# Patient Record
Sex: Female | Born: 1970 | Hispanic: Yes | Marital: Single | Smoking: Never smoker
Health system: Southern US, Community
[De-identification: ages and names within clinical notes are randomized; demographics above are authoritative.]

## PROBLEM LIST (undated history)

## (undated) DIAGNOSIS — T8859XA Other complications of anesthesia, initial encounter: Secondary | ICD-10-CM

## (undated) DIAGNOSIS — Z789 Other specified health status: Secondary | ICD-10-CM

## (undated) DIAGNOSIS — T4145XA Adverse effect of unspecified anesthetic, initial encounter: Secondary | ICD-10-CM

---

## 2018-02-25 ENCOUNTER — Ambulatory Visit: Admit: 2018-02-25 | Payer: Self-pay | Admitting: General Surgery

## 2018-02-25 ENCOUNTER — Other Ambulatory Visit: Payer: Self-pay

## 2018-02-25 ENCOUNTER — Encounter (HOSPITAL_COMMUNITY)
Admission: EM | Disposition: A | Discharge status: Home / Self Care | Payer: Self-pay | Source: Home / Self Care | Attending: Emergency Medicine

## 2018-02-25 ENCOUNTER — Emergency Department (HOSPITAL_COMMUNITY): Admitting: Certified Registered Nurse Anesthetist

## 2018-02-25 ENCOUNTER — Encounter (HOSPITAL_COMMUNITY): Payer: Self-pay

## 2018-02-25 ENCOUNTER — Emergency Department (HOSPITAL_COMMUNITY)

## 2018-02-25 ENCOUNTER — Ambulatory Visit (HOSPITAL_COMMUNITY)
Admission: EM | Admit: 2018-02-25 | Discharge: 2018-02-25 | Disposition: A | Discharge status: Home / Self Care | Attending: General Surgery | Admitting: General Surgery

## 2018-02-25 DIAGNOSIS — W230XXA Caught, crushed, jammed, or pinched between moving objects, initial encounter: Secondary | ICD-10-CM | POA: Diagnosis not present

## 2018-02-25 DIAGNOSIS — Z23 Encounter for immunization: Secondary | ICD-10-CM | POA: Insufficient documentation

## 2018-02-25 DIAGNOSIS — S6710XA Crushing injury of unspecified finger(s), initial encounter: Secondary | ICD-10-CM

## 2018-02-25 DIAGNOSIS — S6992XA Unspecified injury of left wrist, hand and finger(s), initial encounter: Secondary | ICD-10-CM | POA: Diagnosis present

## 2018-02-25 DIAGNOSIS — Y990 Civilian activity done for income or pay: Secondary | ICD-10-CM | POA: Diagnosis not present

## 2018-02-25 DIAGNOSIS — S68625A Partial traumatic transphalangeal amputation of left ring finger, initial encounter: Secondary | ICD-10-CM | POA: Diagnosis not present

## 2018-02-25 HISTORY — DX: Other specified health status: Z78.9

## 2018-02-25 HISTORY — DX: Adverse effect of unspecified anesthetic, initial encounter: T41.45XA

## 2018-02-25 HISTORY — DX: Other complications of anesthesia, initial encounter: T88.59XA

## 2018-02-25 HISTORY — PX: AMPUTATION: SHX166

## 2018-02-25 LAB — POCT PREGNANCY, URINE: Preg Test, Ur: NEGATIVE

## 2018-02-25 SURGERY — AMPUTATION DIGIT
Anesthesia: General | Laterality: Left

## 2018-02-25 MED ORDER — CEFAZOLIN SODIUM-DEXTROSE 1-4 GM/50ML-% IV SOLN
1.0000 g | Freq: Once | INTRAVENOUS | Status: AC
  Administered 2018-02-25: 1 g via INTRAVENOUS
  Filled 2018-02-25: qty 50

## 2018-02-25 MED ORDER — BUPIVACAINE-EPINEPHRINE (PF) 0.25% -1:200000 IJ SOLN
30.0000 mL | Freq: Once | INTRAMUSCULAR | Status: AC
  Administered 2018-02-25: 30 mL
  Filled 2018-02-25: qty 30

## 2018-02-25 MED ORDER — BACITRACIN ZINC 500 UNIT/GM EX OINT
TOPICAL_OINTMENT | CUTANEOUS | Status: AC
  Filled 2018-02-25: qty 28.35

## 2018-02-25 MED ORDER — POVIDONE-IODINE 10 % EX SWAB: 2.0000 "application " | Freq: Once | CUTANEOUS | Status: DC

## 2018-02-25 MED ORDER — LIDOCAINE HCL (CARDIAC) PF 100 MG/5ML IV SOSY
PREFILLED_SYRINGE | INTRAVENOUS | Status: DC | PRN
  Administered 2018-02-25: 60 mg via INTRAVENOUS

## 2018-02-25 MED ORDER — PROPOFOL 10 MG/ML IV BOLUS
INTRAVENOUS | Status: AC
  Filled 2018-02-25: qty 20

## 2018-02-25 MED ORDER — CEPHALEXIN 500 MG PO CAPS: 500.0000 mg | ORAL_CAPSULE | Freq: Three times a day (TID) | ORAL | 0 refills | Status: DC

## 2018-02-25 MED ORDER — PROPOFOL 10 MG/ML IV BOLUS
INTRAVENOUS | Status: DC | PRN
  Administered 2018-02-25: 150 mg via INTRAVENOUS

## 2018-02-25 MED ORDER — FENTANYL CITRATE (PF) 250 MCG/5ML IJ SOLN
INTRAMUSCULAR | Status: DC | PRN
  Administered 2018-02-25: 50 ug via INTRAVENOUS
  Administered 2018-02-25: 100 ug via INTRAVENOUS

## 2018-02-25 MED ORDER — FENTANYL CITRATE (PF) 250 MCG/5ML IJ SOLN
INTRAMUSCULAR | Status: AC
  Filled 2018-02-25: qty 5

## 2018-02-25 MED ORDER — LIDOCAINE HCL 2 % IJ SOLN
20.0000 mL | Freq: Once | INTRAMUSCULAR | Status: DC
  Filled 2018-02-25: qty 20

## 2018-02-25 MED ORDER — FENTANYL CITRATE (PF) 100 MCG/2ML IJ SOLN: 25.0000 ug | INTRAMUSCULAR | Status: DC | PRN

## 2018-02-25 MED ORDER — BUPIVACAINE HCL (PF) 0.25 % IJ SOLN
INTRAMUSCULAR | Status: AC
  Filled 2018-02-25: qty 30

## 2018-02-25 MED ORDER — 0.9 % SODIUM CHLORIDE (POUR BTL) OPTIME
TOPICAL | Status: DC | PRN
  Administered 2018-02-25: 1000 mL

## 2018-02-25 MED ORDER — ONDANSETRON HCL 4 MG/2ML IJ SOLN
INTRAMUSCULAR | Status: DC | PRN
  Administered 2018-02-25: 4 mg via INTRAVENOUS

## 2018-02-25 MED ORDER — TETANUS-DIPHTH-ACELL PERTUSSIS 5-2.5-18.5 LF-MCG/0.5 IM SUSP
0.5000 mL | Freq: Once | INTRAMUSCULAR | Status: AC
  Administered 2018-02-25: 0.5 mL via INTRAMUSCULAR
  Filled 2018-02-25: qty 0.5

## 2018-02-25 MED ORDER — BUPIVACAINE HCL (PF) 0.25 % IJ SOLN
INTRAMUSCULAR | Status: DC | PRN
  Administered 2018-02-25: 8 mL

## 2018-02-25 MED ORDER — CHLORHEXIDINE GLUCONATE 4 % EX LIQD: 60.0000 mL | Freq: Once | CUTANEOUS | Status: DC

## 2018-02-25 MED ORDER — TRAMADOL HCL 50 MG PO TABS: 100.0000 mg | ORAL_TABLET | Freq: Four times a day (QID) | ORAL | 0 refills | Status: AC | PRN

## 2018-02-25 MED ORDER — DEXAMETHASONE SODIUM PHOSPHATE 10 MG/ML IJ SOLN
INTRAMUSCULAR | Status: DC | PRN
  Administered 2018-02-25: 5 mg via INTRAVENOUS

## 2018-02-25 MED ORDER — MIDAZOLAM HCL 5 MG/5ML IJ SOLN
INTRAMUSCULAR | Status: DC | PRN
  Administered 2018-02-25: 2 mg via INTRAVENOUS

## 2018-02-25 MED ORDER — CEFAZOLIN SODIUM-DEXTROSE 2-4 GM/100ML-% IV SOLN
2.0000 g | INTRAVENOUS | Status: AC
  Administered 2018-02-25: 2 g via INTRAVENOUS

## 2018-02-25 MED ORDER — MIDAZOLAM HCL 2 MG/2ML IJ SOLN
INTRAMUSCULAR | Status: AC
  Filled 2018-02-25: qty 2

## 2018-02-25 MED ORDER — SODIUM CHLORIDE 0.9 % IV BOLUS
1000.0000 mL | Freq: Once | INTRAVENOUS | Status: AC
  Administered 2018-02-25: 1000 mL via INTRAVENOUS

## 2018-02-25 MED ORDER — LACTATED RINGERS IV SOLN
INTRAVENOUS | Status: DC
  Administered 2018-02-25: 16:00:00 via INTRAVENOUS

## 2018-02-25 MED ORDER — CEFAZOLIN SODIUM-DEXTROSE 2-4 GM/100ML-% IV SOLN
INTRAVENOUS | Status: AC
  Filled 2018-02-25: qty 100

## 2018-02-25 MED ORDER — BUPIVACAINE HCL 0.5 % IJ SOLN
50.0000 mL | Freq: Once | INTRAMUSCULAR | Status: DC
  Filled 2018-02-25: qty 50

## 2018-02-25 MED ORDER — PROMETHAZINE HCL 25 MG/ML IJ SOLN: 6.2500 mg | INTRAMUSCULAR | Status: DC | PRN

## 2018-02-25 SURGICAL SUPPLY — 35 items
BANDAGE ACE 4X5 VEL STRL LF (GAUZE/BANDAGES/DRESSINGS) ×3 IMPLANT
BNDG ESMARK 4X9 LF (GAUZE/BANDAGES/DRESSINGS) ×3 IMPLANT
BNDG GAUZE ELAST 4 BULKY (GAUZE/BANDAGES/DRESSINGS) ×3 IMPLANT
CHLORAPREP W/TINT 26ML (MISCELLANEOUS) IMPLANT
CORDS BIPOLAR (ELECTRODE) ×3 IMPLANT
COVER WAND RF STERILE (DRAPES) ×3 IMPLANT
DRSG ADAPTIC 3X8 NADH LF (GAUZE/BANDAGES/DRESSINGS) IMPLANT
ELECT REM PT RETURN 9FT ADLT (ELECTROSURGICAL)
ELECTRODE REM PT RTRN 9FT ADLT (ELECTROSURGICAL) IMPLANT
GAUZE SPONGE 4X4 12PLY STRL (GAUZE/BANDAGES/DRESSINGS) ×3 IMPLANT
GAUZE XEROFORM 1X8 LF (GAUZE/BANDAGES/DRESSINGS) ×3 IMPLANT
GLOVE BIOGEL M 8.0 STRL (GLOVE) ×3 IMPLANT
GOWN STRL REUS W/ TWL LRG LVL3 (GOWN DISPOSABLE) ×1 IMPLANT
GOWN STRL REUS W/ TWL XL LVL3 (GOWN DISPOSABLE) ×1 IMPLANT
GOWN STRL REUS W/TWL LRG LVL3 (GOWN DISPOSABLE) ×2
GOWN STRL REUS W/TWL XL LVL3 (GOWN DISPOSABLE) ×2
KIT BASIN OR (CUSTOM PROCEDURE TRAY) ×3 IMPLANT
KIT TURNOVER KIT B (KITS) ×3 IMPLANT
NS IRRIG 1000ML POUR BTL (IV SOLUTION) ×3 IMPLANT
PACK ORTHO EXTREMITY (CUSTOM PROCEDURE TRAY) ×3 IMPLANT
PAD ARMBOARD 7.5X6 YLW CONV (MISCELLANEOUS) ×6 IMPLANT
PAD CAST 4YDX4 CTTN HI CHSV (CAST SUPPLIES) IMPLANT
PADDING CAST COTTON 4X4 STRL (CAST SUPPLIES)
SOAP 2 % CHG 4 OZ (WOUND CARE) IMPLANT
SOLUTION BETADINE 4OZ (MISCELLANEOUS) ×3 IMPLANT
SPONGE LAP 18X18 RF (DISPOSABLE) ×3 IMPLANT
SPONGE LAP 4X18 RFD (DISPOSABLE) ×3 IMPLANT
SUT ETHILON 5 0 PS 2 18 (SUTURE) ×3 IMPLANT
SYR CONTROL 10ML LL (SYRINGE) ×3 IMPLANT
TOWEL OR 17X24 6PK STRL BLUE (TOWEL DISPOSABLE) ×3 IMPLANT
TOWEL OR 17X26 10 PK STRL BLUE (TOWEL DISPOSABLE) ×3 IMPLANT
TUBE CONNECTING 12'X1/4 (SUCTIONS)
TUBE CONNECTING 12X1/4 (SUCTIONS) IMPLANT
WATER STERILE IRR 1000ML POUR (IV SOLUTION) ×3 IMPLANT
YANKAUER SUCT BULB TIP NO VENT (SUCTIONS) IMPLANT

## 2018-02-25 NOTE — Consult Note (Signed)
Reason for Consult:Left ring finger injury Referring Physician: D Linda Hedges Alexa Hatfield is an 48 y.o. female.  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 She came to the ED for evaluation and hand surgery was consulted. She is RHD and Spanish-speaking only. Visit conducted with aid of interpreter.  No past medical history on file.  No family history on file.  Social History:  has no history on file for tobacco, alcohol, and drug.  Allergies: No Known Allergies  Medications: I have reviewed the patient's current medications.  No results found for this or any previous visit (from the past 48 hour(s)).  Dg Finger Ring Left  Result Date: 02/25/2018 CLINICAL DATA:  Amputation injury to the distal left fourth finger at work today EXAM: LEFT RING FINGER 2+V COMPARISON:  None. FINDINGS: There is a markedly comminuted compound fracture involving the entire distal phalanx of the left fourth finger including the proximal articular surface, with minimal 2 mm displacement of the distal fracture fragments. There is soft tissue amputation at the ulnar distal left fourth finger. No dislocation. No focal osseous lesions. No radiopaque foreign body. IMPRESSION: Markedly comminuted intra-articular compound fracture involving the entire distal phalanx of the left fourth finger, with ulnar aspect distal left fourth finger soft tissue amputation. Electronically Signed   By: Delbert Phenix M.Alexa.   On: 02/25/2018 12:00    Review of Systems  Constitutional: Negative for weight loss.  HENT: Negative for ear discharge, ear pain, hearing loss and tinnitus.   Eyes: Negative for blurred vision, double vision, photophobia and pain.  Respiratory: Negative for cough, sputum production and shortness of breath.   Cardiovascular: Negative for chest pain.  Gastrointestinal: Negative for abdominal pain, nausea and vomiting.  Genitourinary: Negative for dysuria,  flank pain, frequency and urgency.  Musculoskeletal: Positive for joint pain (Left ring finger). Negative for back pain, falls, myalgias and neck pain.  Neurological: Negative for dizziness, tingling, sensory change, focal weakness, loss of consciousness and headaches.  Endo/Heme/Allergies: Does not bruise/bleed easily.  Psychiatric/Behavioral: Negative for depression, memory loss and substance abuse. The patient is not nervous/anxious.    Blood pressure (!) 159/95, pulse 74, temperature 98.4 F (36.9 C), temperature source Oral, resp. rate 18, height 5\' 3"  (1.6 m), weight 54.4 kg, last menstrual period 02/06/2018, SpO2 100 %. Physical Exam  Constitutional: She appears well-developed and well-nourished. No distress.  HENT:  Head: Normocephalic and atraumatic.  Eyes: Conjunctivae are normal. Right eye exhibits no discharge. Left eye exhibits no discharge. No scleral icterus.  Neck: Normal range of motion.  Cardiovascular: Normal rate and regular rhythm.  Respiratory: Effort normal. No respiratory distress.  Musculoskeletal:     Comments: Left shoulder, elbow, wrist, digits- Partial amputation of P3, approx 50% of ulnar aspect missing including most of nail, flex/ext DIP 5/5, no instability, no blocks to motion  Sens  Ax/R/M/U intact  Mot   Ax/ R/ PIN/ M/ AIN/ U intact  Rad 2+  Neurological: She is alert.  Skin: Skin is warm and dry. She is not diaphoretic.  Psychiatric: She has a normal mood and affect. Her behavior is normal.      Assessment/Plan: Left ring finger partial amputation -- Will need amputation revision by Dr. Izora Hatfield this afternoon. Anticipate discharge post-op. Please keep NPO.    Alexa Caldron, PA-C Orthopedic Surgery [phone] 02/25/2018, 12:33 PM

## 2018-02-25 NOTE — Discharge Instructions (Addendum)
Discharge Instructions:  Keep your dressing clean, dry and in place until instructed to remove by Dr. Coley.  If the dressing becomes dirty or wet call the office for instructions during business hours. Elevate the extremity to help with swelling, this will also help with any discomfort. Take your medication as prescribed. No lifting with the injured  extremity. If you feel that the dressing is too tight, you may loosen it, but keep it on; finger tips should be pink; if there is a concern, call the office. [phone] Ice may be used if the injury is a fracture, do not apply ice directly to the skin. Please call the office on the next business day after discharge to arrange a follow up appointment.  Call [phone] between the hours of 9am - 5pm M-Th or 9am - 1pm on Fri. For most hand injuries and/or conditions, you may return to work using the uninjured hand (one handed duty) within 24-72 hours.  A detailed note will be provided to you at your follow up appointment or may contact the office prior to your follow up.    

## 2018-02-25 NOTE — ED Provider Notes (Signed)
[facility] EMERGENCY DEPARTMENT Provider Note   CSN: [id number] Arrival date & time: 02/25/18  1119    History   Chief Complaint Chief Complaint  Patient presents with  . Finger Injury    HPI Alexa Hatfield is a 48 y.o. female.     HPI   48 year old female presents today with amputation of her right distal fourth finger.  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  She notes immediate pain.  She was brought via EMS no medication given prior.  Patient denies any other injuries throughout the hand.  Uncertain tetanus vaccination status.  No past medical history on file.  There are no active problems to display for this patient.    The histories are not reviewed yet. Please review them in the "History" navigator section and refresh this SmartLink.   OB History   No obstetric history on file.      Home Medications    Prior to Admission medications   Not on File    Family History No family history on file.  Social History Social History   Tobacco Use  . Smoking status: Not on file  Substance Use Topics  . Alcohol use: Not on file  . Drug use: Not on file     Allergies   Patient has no known allergies.   Review of Systems Review of Systems  All other systems reviewed and are negative.    Physical Exam Updated Vital Signs BP (!) 159/95 (BP Location: Right Arm)   Pulse 74   Temp 98.4 F (36.9 C) (Oral)   Resp 18   Ht 5\' 3"  (1.6 m)   Wt 54.4 kg   LMP 02/06/2018   SpO2 100%   BMI 21.26 kg/m   Physical Exam Vitals signs and nursing note reviewed.  Constitutional:      Appearance: She is well-developed.  HENT:     Head: Normocephalic and atraumatic.  Eyes:     General: No scleral icterus.       Right eye: No discharge.        Left eye: No discharge.     Conjunctiva/sclera: Conjunctivae normal.     Pupils: Pupils are equal, round, and reactive to light.  Neck:   Musculoskeletal: Normal range of motion.     Vascular: No JVD.     Trachea: No tracheal deviation.  Pulmonary:     Effort: Pulmonary effort is normal.     Breath sounds: No stridor.  Musculoskeletal:     Comments: See photo below-nontender to palpation  Neurological:     Mental Status: She is alert and oriented to person, place, and time.     Coordination: Coordination normal.  Psychiatric:        Behavior: Behavior normal.        Thought Content: Thought content normal.        Judgment: Judgment normal.        ED Treatments / Results  Labs (all labs ordered are listed, but only abnormal results are displayed) Labs Reviewed - No data to display  EKG None  Radiology Dg Finger Ring Left  Result Date: 02/25/2018 CLINICAL DATA:  Amputation injury to the distal left fourth finger at work today EXAM: LEFT RING FINGER 2+V COMPARISON:  None. FINDINGS: There is a markedly comminuted compound fracture involving the entire distal phalanx of the left fourth finger including the proximal articular surface, with  minimal 2 mm displacement of the distal fracture fragments. There is soft tissue amputation at the ulnar distal left fourth finger. No dislocation. No focal osseous lesions. No radiopaque foreign body. IMPRESSION: Markedly comminuted intra-articular compound fracture involving the entire distal phalanx of the left fourth finger, with ulnar aspect distal left fourth finger soft tissue amputation. Electronically Signed   By: Delbert Phenix M.D.   On: 02/25/2018 12:00    Procedures Procedures (including critical care time)  Medications Ordered in ED Medications  lidocaine (XYLOCAINE) 2 % (with pres) injection 400 mg (has no administration in time range)  ceFAZolin (ANCEF) IVPB 1 g/50 mL premix (0 g Intravenous Stopped 02/25/18 1257)  Tdap (BOOSTRIX) injection 0.5 mL (0.5 mLs Intramuscular Given 02/25/18 1209)  bupivacaine-epinephrine (MARCAINE W/ EPI) 0.25% -1:200000 injection 30 mL (30  mLs Infiltration Given 02/25/18 1214)  sodium chloride 0.9 % bolus 1,000 mL (0 mLs Intravenous Stopped 02/25/18 1257)     Initial Impression / Assessment and Plan / ED Course  I have reviewed the triage vital signs and the nursing notes.  Pertinent labs & imaging results that were available during my care of the patient were reviewed by me and considered in my medical decision making (see chart for details).        48 year old female presents today with amputation of the distal phalanx on the right hand.  Orthopedics consulted.  Patient received antibiotics, wound was irrigated at bedside, tetanus updated.  She will go to the OR for management.  Final Clinical Impressions(s) / ED Diagnoses   Final diagnoses:  Crushing injury of finger, initial encounter    ED Discharge Orders    None       Alexa Hatfield 02/25/18 1326    Loren Racer, MD 02/26/18 1527

## 2018-02-25 NOTE — Progress Notes (Signed)
Attempted to contact pharmacy for medication reconciliation, with no response.  However patient states she does not take any medications.

## 2018-02-25 NOTE — Anesthesia Procedure Notes (Signed)
Procedure Name: LMA Insertion Date/Time: 02/25/2018 3:58 PM Performed by: Marena Chancy, CRNA Pre-anesthesia Checklist: Patient identified, Emergency Drugs available, Suction available and Patient being monitored Patient Re-evaluated:Patient Re-evaluated prior to induction Oxygen Delivery Method: Circle System Utilized Preoxygenation: Pre-oxygenation with 100% oxygen Induction Type: IV induction Ventilation: Mask ventilation without difficulty LMA: LMA inserted LMA Size: 4.0 Number of attempts: 1 Airway Equipment and Method: Bite block Placement Confirmation: positive ETCO2 Tube secured with: Tape Dental Injury: Teeth and Oropharynx as per pre-operative assessment

## 2018-02-25 NOTE — ED Notes (Signed)
Patient transported to X-ray 

## 2018-02-25 NOTE — Anesthesia Preprocedure Evaluation (Signed)
Anesthesia Evaluation  Patient identified by MRN, date of birth, ID band Patient awake    Reviewed: Allergy & Precautions, NPO status , Patient's Chart, lab work & pertinent test results  Airway Mallampati: II  TM Distance: >3 FB Neck ROM: Full    Dental no notable dental hx.    Pulmonary neg pulmonary ROS,    Pulmonary exam normal breath sounds clear to auscultation       Cardiovascular negative cardio ROS Normal cardiovascular exam Rhythm:Regular Rate:Normal     Neuro/Psych negative neurological ROS  negative psych ROS   GI/Hepatic negative GI ROS, Neg liver ROS,   Endo/Other  negative endocrine ROS  Renal/GU negative Renal ROS  negative genitourinary   Musculoskeletal negative musculoskeletal ROS (+)   Abdominal   Peds negative pediatric ROS (+)  Hematology negative hematology ROS (+)   Anesthesia Other Findings   Reproductive/Obstetrics negative OB ROS                             Anesthesia Physical Anesthesia Plan  ASA: II  Anesthesia Plan: General   Post-op Pain Management:    Induction: Intravenous  PONV Risk Score and Plan: 3 and Ondansetron, Dexamethasone and Treatment may vary due to age or medical condition  Airway Management Planned: LMA  Additional Equipment:   Intra-op Plan:   Post-operative Plan: Extubation in OR  Informed Consent: I have reviewed the patients History and Physical, chart, labs and discussed the procedure including the risks, benefits and alternatives for the proposed anesthesia with the patient or authorized representative who has indicated his/her understanding and acceptance.     Dental advisory given  Plan Discussed with: CRNA and Surgeon  Anesthesia Plan Comments:         Anesthesia Quick Evaluation  

## 2018-02-25 NOTE — Transfer of Care (Signed)
Immediate Anesthesia Transfer of Care Note  Patient: Alexa Hatfield  Procedure(s) Performed: AMPUTATION OF FINGER (Left )  Patient Location: PACU  Anesthesia Type:General  Level of Consciousness: awake and patient cooperative  Airway & Oxygen Therapy: Patient Spontanous Breathing and Patient connected to face mask oxygen  Post-op Assessment: Report given to RN, Post -op Vital signs reviewed and stable and Patient moving all extremities X 4  Post vital signs: Reviewed and stable  Last Vitals:  Vitals Value Taken Time  BP 121/81 02/25/2018  4:36 PM  Temp    Pulse 96 02/25/2018  4:36 PM  Resp 14 02/25/2018  4:36 PM  SpO2 100 % 02/25/2018  4:36 PM  Vitals shown include unvalidated device data.  Last Pain:  Vitals:   02/25/18 1532  TempSrc:   PainSc: 0-No pain      Patients Stated Pain Goal: 0 (02/25/18 1131)  Complications: No apparent anesthesia complications

## 2018-02-25 NOTE — Op Note (Signed)
NAME: Alexa Hatfield, Alexa Hatfield MEDICAL RECORD IO:[id number] ACCOUNT [id number] DATE OF BIRTH:07/17/1970 FACILITY: [facility] LOCATION: [facility] PHYSICIAN:Suhaib Guzzo Harle Battiest, MD  OPERATIVE REPORT  DATE OF PROCEDURE:  02/25/2018  PREOPERATIVE DIAGNOSIS:  Partial amputation of her left small finger.  POSTOPERATIVE DIAGNOSIS:  Partial amputation of her left small finger.  PROCEDURE:  Revision amputation of the left ring finger with a local advancement flap.  ANESTHESIA:  General.  COMPLICATIONS:  No acute complications.  SPECIMENS:  No specimens.  INDICATIONS:  The patient is a 48 year old female who was at work and had some press type machine come down on her distal left ring finger nearly amputating the finger just proximal to the base of the nail.  On evaluation, she had what appeared to be a  non-salvageable fingertip with comminuted open fracture of the distal phalanx, significant skin loss on the radial side.  Risks, benefits and alternatives of revision amputation were discussed with her.  She agreed with this course of action.  CONSENT:  Consent was obtained.  DESCRIPTION OF PROCEDURE:  The patient was taken to the operating room and placed supine on the operating table.  Anesthesia was administered.  A timeout was performed identifying the correct procedure, preoperative antibiotics were given.  The left  upper extremity was prepped and draped in normal sterile fashion.  A tourniquet was used on the upper arm exsanguinated and inflated to 250 mmHg.  The wound was addressed.  There was quite a bit of destruction on the radial aspect of the ring finger.   There was open fracture, multiple distal phalanx fragments.  The neurovascular bundle on the radial side was obliterated.  There was a larger skin flap on the ulnar side with some residual nail bed present.  The full thickness skin, subcutaneous tissue,  bone and nail bed were debrided to create a local advancement flap to cover the defect.   The entire distal phalanx had to be removed to create a tension-free closure.  This wound was then irrigated thoroughly with saline solution.  The tourniquet was  then released.  Hemostasis was controlled with bipolar cautery.  The skin flap was then advanced over the edge of the area of the distal part of the middle phalanx and closed nicely with multiple interrupted 5-0 nylon sutures.  Sterile dressing was  applied.  The patient tolerated the procedure well.  TN/NUANCE  D:02/25/2018 T:02/25/2018 JOB:[id number]

## 2018-02-25 NOTE — ED Triage Notes (Signed)
Reported Lt ring finger injury from a metal press at work.. Crush injury to Lt ring finger

## 2018-02-25 NOTE — Progress Notes (Signed)
I have seen and examined this patient and agree with the history and physical exam by PA Charma Igo.   Will proceed with revision amputation.  All questions answered.

## 2018-02-26 ENCOUNTER — Encounter (HOSPITAL_COMMUNITY): Payer: Self-pay | Admitting: General Surgery

## 2018-02-26 NOTE — Anesthesia Postprocedure Evaluation (Signed)
Anesthesia Post Note  Patient: Alexa Hatfield  Procedure(s) Performed: AMPUTATION OF FINGER (Left )     Patient location during evaluation: PACU Anesthesia Type: General Level of consciousness: awake and alert Pain management: pain level controlled Vital Signs Assessment: post-procedure vital signs reviewed and stable Respiratory status: spontaneous breathing, nonlabored ventilation, respiratory function stable and patient connected to nasal cannula oxygen Cardiovascular status: blood pressure returned to baseline and stable Postop Assessment: no apparent nausea or vomiting Anesthetic complications: no    Last Vitals:  Vitals:   02/25/18 1650 02/25/18 1715  BP: 120/85 119/79  Pulse: 83 74  Resp: 18 16  Temp: 36.5 C   SpO2: 97% 99%    Last Pain:  Vitals:   02/25/18 1650  TempSrc:   PainSc: 0-No pain                 Marcia Hartwell S

## 2018-04-10 ENCOUNTER — Ambulatory Visit: Payer: No Typology Code available for payment source | Admitting: Occupational Therapy

## 2020-11-28 ENCOUNTER — Emergency Department (HOSPITAL_COMMUNITY): Payer: 59

## 2020-11-28 ENCOUNTER — Inpatient Hospital Stay (HOSPITAL_BASED_OUTPATIENT_CLINIC_OR_DEPARTMENT_OTHER)
Admission: EM | Admit: 2020-11-28 | Discharge: 2020-12-05 | DRG: 065 | Disposition: A | Discharge status: Home / Self Care | Payer: 59 | Attending: Internal Medicine | Admitting: Internal Medicine

## 2020-11-28 ENCOUNTER — Encounter (HOSPITAL_BASED_OUTPATIENT_CLINIC_OR_DEPARTMENT_OTHER): Payer: Self-pay

## 2020-11-28 ENCOUNTER — Other Ambulatory Visit: Payer: Self-pay

## 2020-11-28 DIAGNOSIS — Z20822 Contact with and (suspected) exposure to covid-19: Secondary | ICD-10-CM | POA: Diagnosis present

## 2020-11-28 DIAGNOSIS — I639 Cerebral infarction, unspecified: Secondary | ICD-10-CM

## 2020-11-28 DIAGNOSIS — I10 Essential (primary) hypertension: Secondary | ICD-10-CM | POA: Diagnosis present

## 2020-11-28 DIAGNOSIS — Z8249 Family history of ischemic heart disease and other diseases of the circulatory system: Secondary | ICD-10-CM

## 2020-11-28 DIAGNOSIS — Z79899 Other long term (current) drug therapy: Secondary | ICD-10-CM

## 2020-11-28 DIAGNOSIS — I6340 Cerebral infarction due to embolism of unspecified cerebral artery: Secondary | ICD-10-CM | POA: Diagnosis not present

## 2020-11-28 DIAGNOSIS — I253 Aneurysm of heart: Secondary | ICD-10-CM | POA: Diagnosis present

## 2020-11-28 DIAGNOSIS — Q2112 Patent foramen ovale: Secondary | ICD-10-CM

## 2020-11-28 DIAGNOSIS — Z89022 Acquired absence of left finger(s): Secondary | ICD-10-CM

## 2020-11-28 DIAGNOSIS — R42 Dizziness and giddiness: Secondary | ICD-10-CM | POA: Diagnosis present

## 2020-11-28 DIAGNOSIS — I6389 Other cerebral infarction: Secondary | ICD-10-CM | POA: Diagnosis present

## 2020-11-28 DIAGNOSIS — R29700 NIHSS score 0: Secondary | ICD-10-CM | POA: Diagnosis present

## 2020-11-28 DIAGNOSIS — E872 Acidosis, unspecified: Secondary | ICD-10-CM | POA: Diagnosis present

## 2020-11-28 DIAGNOSIS — E785 Hyperlipidemia, unspecified: Secondary | ICD-10-CM | POA: Diagnosis present

## 2020-11-28 DIAGNOSIS — Z8673 Personal history of transient ischemic attack (TIA), and cerebral infarction without residual deficits: Secondary | ICD-10-CM

## 2020-11-28 DIAGNOSIS — H532 Diplopia: Secondary | ICD-10-CM | POA: Diagnosis present

## 2020-11-28 LAB — BASIC METABOLIC PANEL
Anion gap: 8 (ref 5–15)
BUN: 9 mg/dL (ref 6–20)
CO2: 17 mmol/L — ABNORMAL LOW (ref 22–32)
Calcium: 8.9 mg/dL (ref 8.9–10.3)
Chloride: 112 mmol/L — ABNORMAL HIGH (ref 98–111)
Creatinine, Ser: 0.95 mg/dL (ref 0.44–1.00)
GFR, Estimated: >60 mL/min (ref >60)
Glucose, Bld: 91 mg/dL (ref 70–99)
Potassium: 3.8 mmol/L (ref 3.5–5.1)
Sodium: 137 mmol/L (ref 135–145)

## 2020-11-28 LAB — CBC WITH DIFFERENTIAL/PLATELET
Abs Immature Granulocytes: 0.03 10*3/uL (ref 0.00–0.07)
Basophils Absolute: 0 10*3/uL (ref 0.0–0.1)
Basophils Relative: 0 %
Eosinophils Absolute: 0.1 10*3/uL (ref 0.0–0.5)
Eosinophils Relative: 1 %
HCT: 40.7 % (ref 36.0–46.0)
Hemoglobin: 13.6 g/dL (ref 12.0–15.0)
Immature Granulocytes: 0 %
Lymphocytes Relative: 15 %
Lymphs Abs: 1.5 10*3/uL (ref 0.7–4.0)
MCH: 31.4 pg (ref 26.0–34.0)
MCHC: 33.4 g/dL (ref 30.0–36.0)
MCV: 94 fL (ref 80.0–100.0)
Monocytes Absolute: 0.5 10*3/uL (ref 0.1–1.0)
Monocytes Relative: 6 %
Neutro Abs: 7.4 10*3/uL (ref 1.7–7.7)
Neutrophils Relative %: 78 %
Platelets: 234 10*3/uL (ref 150–400)
RBC: 4.33 MIL/uL (ref 3.87–5.11)
RDW: 13.2 % (ref 11.5–15.5)
WBC: 9.5 10*3/uL (ref 4.0–10.5)
nRBC: 0 % (ref 0.0–0.2)

## 2020-11-28 LAB — RESP PANEL BY RT-PCR (FLU A&B, COVID) ARPGX2
Influenza A by PCR: NEGATIVE
Influenza B by PCR: NEGATIVE
SARS Coronavirus 2 by RT PCR: NEGATIVE

## 2020-11-28 MED ORDER — ACETAMINOPHEN 160 MG/5ML PO SOLN: 650.0000 mg | ORAL | Status: DC | PRN

## 2020-11-28 MED ORDER — STROKE: EARLY STAGES OF RECOVERY BOOK
Freq: Once | Status: AC
  Filled 2020-11-28: qty 1

## 2020-11-28 MED ORDER — SODIUM CHLORIDE 0.9 % IV SOLN: INTRAVENOUS | Status: AC

## 2020-11-28 MED ORDER — ENOXAPARIN SODIUM 40 MG/0.4ML IJ SOSY
40.0000 mg | PREFILLED_SYRINGE | INTRAMUSCULAR | Status: DC
  Administered 2020-11-28 – 2020-12-04 (×7): 40 mg via SUBCUTANEOUS
  Filled 2020-11-28 (×7): qty 0.4

## 2020-11-28 MED ORDER — ACETAMINOPHEN 650 MG RE SUPP: 650.0000 mg | RECTAL | Status: DC | PRN

## 2020-11-28 MED ORDER — ACETAMINOPHEN 325 MG PO TABS: 650.0000 mg | ORAL_TABLET | ORAL | Status: DC | PRN

## 2020-11-28 MED ORDER — GADOBUTROL 1 MMOL/ML IV SOLN
6.5000 mL | Freq: Once | INTRAVENOUS | Status: AC | PRN
  Administered 2020-11-28: 6.5 mL via INTRAVENOUS

## 2020-11-28 MED ORDER — SENNOSIDES-DOCUSATE SODIUM 8.6-50 MG PO TABS: 1.0000 | ORAL_TABLET | Freq: Every evening | ORAL | Status: DC | PRN

## 2020-11-28 NOTE — ED Triage Notes (Signed)
Pt states she has been seeing double 3 days ago and increased fatigue.. Daughter states has had increased memory problems.   Went to [place] and was noted to be drowsy and acting abnormal, went to ER there, was referred to neurologist. Stated there was nerve damage from birth control pills.

## 2020-11-28 NOTE — ED Notes (Signed)
Patient getting letters wrong on multiple lines, confusing them for different letters. Patients family member states that patients vision is not normally this affected, so that this is a new problem. Patient family states no issue with language barrier and that patients vision is usually okay when wearing her glasses.

## 2020-11-28 NOTE — ED Notes (Addendum)
MRI notified of patients arrival from [facility].

## 2020-11-28 NOTE — ED Provider Notes (Signed)
Patient transferred from [facility] for MRI due to the diplopia.  Anticipate disposition based on MRI results.  I reviewed the MRI and then spoke to neurology who agrees it appears like an acute stroke.  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  Neurology says that she will need admission for further stroke work-up.  When patient returns from MRI and I evaluate her, will place admission call.  I assessed patient and she is resting comfortably.  She is keeping 1 eye closed to help with the diplopia.  She continues to deny any other symptoms aside from the vision changes.  She was informed of the acute stroke seen on MRI and that she will be admitted.  Neurology will see patient and she will be admitted.  Clinical Impression: 1. Diplopia   2. Cerebrovascular accident (CVA), unspecified mechanism (HCC)     Disposition: Admit  This note was prepared with assistance of Dragon voice recognition software. Occasional wrong-word or sound-a-like substitutions may have occurred due to the inherent limitations of voice recognition software.     Maysel Mccolm, Canary Brim, MD 11/28/20 2225

## 2020-11-28 NOTE — ED Provider Notes (Signed)
[facility] EMERGENCY DEPARTMENT Provider Note   CSN: [id number] Arrival date & time: 11/28/20  1113     History Chief Complaint  Patient presents with   Diplopia    Alexa Hatfield is a 50 y.o. female who presents the emergency department with a 3-day history of constant bilateral diplopia.  Patient states that she recently stopped taking her birth control after returning from the [place] 4 days ago and she attributes these to her symptoms.  Nothing seems to make it better or worse.  She is unable to characterize her diplopia.  She denies any eye pain, drainage, visual loss, focal weakness/numbness, cough, congestion, fever, chills, chest pain, and shortness of breath.  A language interpreter was used.      Past Medical History:  Diagnosis Date   Complication of anesthesia    "difficulty breathing and required O2 during c-section"   Medical history non-contributory     There are no problems to display for this patient.   Past Surgical History:  Procedure Laterality Date   AMPUTATION Left 02/25/2018   Procedure: AMPUTATION OF FINGER;  Surgeon: Alexa Neu, MD;  Location: [facility] OR;  Service: Plastics;  Laterality: Left;   CESAREAN SECTION     x2     OB History   No obstetric history on file.     History reviewed. No pertinent family history.  Social History   Tobacco Use   Smoking status: Never   Smokeless tobacco: Never  Vaping Use   Vaping Use: Never used  Substance Use Topics   Alcohol use: Not Currently   Drug use: Never    Home Medications Prior to Admission medications   Medication Sig Start Date End Date Taking? Authorizing Provider  cephALEXin (KEFLEX) 500 MG capsule Take 1 capsule (500 mg total) by mouth 3 (three) times daily. 02/25/18   Alexa Neu, MD    Allergies    Patient has no known allergies.  Review of Systems   Review of Systems  All other systems reviewed and are negative.  Physical Exam Updated Vital  Signs BP (!) 129/92   Pulse 68   Temp 98.2 F (36.8 C) (Oral)   Resp 20   Ht 5\' 3"  (1.6 m)   Wt 67 kg   LMP 11/03/2020 (Approximate)   SpO2 100%   BMI 26.17 kg/m   Physical Exam Vitals and nursing note reviewed.  Constitutional:      General: She is not in acute distress.    Appearance: Normal appearance.  HENT:     Head: Normocephalic and atraumatic.  Eyes:     General:        Right eye: No discharge.        Left eye: No discharge.     Extraocular Movements:     Right eye: Abnormal extraocular motion present.     Left eye: Abnormal extraocular motion present.     Pupils: Pupils are equal, round, and reactive to light.  Cardiovascular:     Comments: Regular rate and rhythm.  S1/S2 are distinct without any evidence of murmur, rubs, or gallops.  Radial pulses are 2+ bilaterally.  Dorsalis pedis pulses are 2+ bilaterally.  No evidence of pedal edema. Pulmonary:     Comments: Clear to auscultation bilaterally.  Normal effort.  No respiratory distress.  No evidence of wheezes, rales, or rhonchi heard throughout. Abdominal:     General: Abdomen is flat. Bowel sounds are normal. There is no distension.  Tenderness: There is no abdominal tenderness. There is no guarding or rebound.  Musculoskeletal:        General: Normal range of motion.     Cervical back: Neck supple.  Skin:    General: Skin is warm and dry.     Findings: No rash.  Neurological:     General: No focal deficit present.     Mental Status: She is alert.     Comments: Patient having trouble tracking to the right with extra occular movements. The rest of her occular movements are intact. The rest of her cranial nerves are intact. 5/5 strength in the upper and Hatfield extremities.  Normal sensation to the upper and Hatfield extremities.  Psychiatric:        Mood and Affect: Mood normal.        Behavior: Behavior normal.    ED Results / Procedures / Treatments   Labs (all labs ordered are listed, but only abnormal  results are displayed) Labs Reviewed  BASIC METABOLIC PANEL - Abnormal; Notable for the following components:      Result Value   Chloride 112 (*)    CO2 17 (*)    All other components within normal limits  CBC WITH DIFFERENTIAL/PLATELET    EKG None  Radiology No results found.  Procedures Procedures   Medications Ordered in ED Medications - No data to display  ED Course  I have reviewed the triage vital signs and the nursing notes.  Pertinent labs & imaging results that were available during my care of the patient were reviewed by me and considered in my medical decision making (see chart for details).  Clinical Course as of 11/28/20 1550  Mon Nov 28, 2020  1448 I spoke with Dr. Iver Hatfield with neurology who recommended MRI brain and orbits with and without contrast. [CF]    Clinical Course User Index [CF] Alexa Lower, PA-C   MDM Rules/Calculators/A&P                          Alexa Hatfield is a 50 y.o. female who presents the emergency department for for evaluation of diplopia.  History and physical exam is concerning for cranial nerve palsy.  It is difficult to assess which cranial nerve as there is discrepancy between myself and Dr. Salena Hatfield physical exam.  After discussing the case with neurology thinks that she needs to be further evaluated with an MRI which cannot be performed at [facility].  I will transfer the patient to [facility] for MRI. Spoke with Dr. Particia Hatfield who let Dr. Anitra Hatfield know and will accept the patient.  Patient will go by private vehicle.  The daughter will be driving her.   Final Clinical Impression(s) / ED Diagnoses Final diagnoses:  Diplopia    Rx / DC Orders ED Discharge Orders     None        Alexa Hatfield 11/28/20 1937    Alexa Sleeper, MD 11/29/20 [phone]

## 2020-11-28 NOTE — ED Notes (Signed)
Back from MRI at this time. Waiting for further orders.

## 2020-11-28 NOTE — ED Notes (Signed)
Pt for transfer to [facility] ED for MRI, Dr [name] accepting.  Pt to transfer by private vehicle, PIV in place to right [employer], wrapped for protection for transport.  Family provided directions to [facility]

## 2020-11-28 NOTE — ED Notes (Signed)
Utilized spanish interpreter

## 2020-11-28 NOTE — ED Notes (Signed)
Pt sitting up on stretcher eating soup that was brought in by family. Pt denies any complaints, states her vision has improved. No acute changes noted. Will continue to monitor.

## 2020-11-28 NOTE — ED Notes (Signed)
Admitting Provider at bedside. 

## 2020-11-28 NOTE — ED Notes (Signed)
Patient transported to MRI 

## 2020-11-28 NOTE — H&P (Signed)
History and Physical    Alexa Hatfield OAC:[id number] DOB: [birth year] DOA: 11/28/2020  PCP: Pcp, No   Patient coming from: Home   Chief Complaint: Double vision, fatigue   HPI: Alexa Hatfield is a pleasant 50 y.o. female who denies any significant past medical history now presents emergency department for evaluation of diplopia and fatigue.  Patient complains of 3 days of fatigue and diplopia that she states was evaluated in the [place] with head CT that she was told was normal, and the symptoms were attributed to her birth control pills.  Symptoms have persisted despite stopping the oral contraceptive that the patient seems to be beginning to improve some tonight.  She denies any headache, focal numbness or weakness, difficulty swallowing, fevers, chills, chest pain, or palpitations.  She has never experienced this previously.  ED Course: Upon arrival to the ED, patient is found to be afebrile, saturating well on room air, and with stable blood pressure.  EKG features normal sinus rhythm.  Chemistry panel with bicarbonate 17 and CBC is unremarkable.  MRI brain is concerning for acute early subacute infarctions in the left midbrain, thalamus, and left frontal lobe, as well as a 3 mm focus of cortical enhancement in the left temporal lobe without edema or diffusion abnormality which could be infarction or vascular.  Neurology was consulted by the ED physician and hospitalists asked to admit.  Review of Systems:  All other systems reviewed and apart from HPI, are negative.  Past Medical History:  Diagnosis Date   Complication of anesthesia    "difficulty breathing and required O2 during c-section"   Medical history non-contributory     Past Surgical History:  Procedure Laterality Date   AMPUTATION Left 02/25/2018   Procedure: AMPUTATION OF FINGER;  Surgeon: Knute Neu, MD;  Location: [facility] OR;  Service: Plastics;  Laterality: Left;   CESAREAN SECTION     x2     Social History:   reports that she has never smoked. She has never used smokeless tobacco. She reports that she does not currently use alcohol. She reports that she does not use drugs.  No Known Allergies  Family History  Problem Relation Age of Onset   Heart attack Father      Prior to Admission medications   Medication Sig Start Date End Date Taking? Authorizing Provider  cephALEXin (KEFLEX) 500 MG capsule Take 1 capsule (500 mg total) by mouth 3 (three) times daily. 02/25/18   Knute Neu, MD    Physical Exam: Vitals:   11/28/20 1500 11/28/20 1751 11/28/20 2030 11/28/20 2320  BP: (!) 129/92 (!) 136/99 129/89 130/74  Pulse: 68 99 69 72  Resp: 20 17 16 16   Temp:  97.9 F (36.6 C) 98 F (36.7 C) 98.2 F (36.8 C)  TempSrc:   Oral Oral  SpO2: 100% 100% 100% 100%  Weight:      Height:        Constitutional: NAD, calm  Eyes: PERTLA, lids and conjunctivae normal ENMT: Mucous membranes are moist. Posterior pharynx clear of any exudate or lesions.   Neck: supple, no masses  Respiratory:  no wheezing, no crackles. No accessory muscle use.  Cardiovascular: S1 & S2 heard, regular rate and rhythm. No extremity edema.   Abdomen: No distension, no tenderness, soft. Bowel sounds active.  Musculoskeletal: no clubbing / cyanosis. No joint deformity upper and lower extremities.   Skin: no significant rashes, lesions, ulcers. Warm, dry, well-perfused. Neurologic: No facial asymmetry or numbness, no  dysarthria or aphasia. Gross visual acuity deficit. Sensation to light touch intact. Strength 5/5 in all 4 limbs. Alert and oriented.  Psychiatric: Pleasant. Cooperative.    Labs and Imaging on Admission: I have personally reviewed following labs and imaging studies  CBC: Recent Labs  Lab 11/28/20 1406  WBC 9.5  NEUTROABS 7.4  HGB 13.6  HCT 40.7  MCV 94.0  PLT 234   Basic Metabolic Panel: Recent Labs  Lab 11/28/20 1406  NA 137  K 3.8  CL 112*  CO2 17*  GLUCOSE 91   BUN 9  CREATININE 0.95  CALCIUM 8.9   GFR: Estimated Creatinine Clearance: 65.8 mL/min (by C-G formula based on SCr of 0.95 mg/dL). Liver Function Tests: No results for input(s): AST, ALT, ALKPHOS, BILITOT, PROT, ALBUMIN in the last 168 hours. No results for input(s): LIPASE, AMYLASE in the last 168 hours. No results for input(s): AMMONIA in the last 168 hours. Coagulation Profile: No results for input(s): INR, PROTIME in the last 168 hours. Cardiac Enzymes: No results for input(s): CKTOTAL, CKMB, CKMBINDEX, TROPONINI in the last 168 hours. BNP (last 3 results) No results for input(s): PROBNP in the last 8760 hours. HbA1C: No results for input(s): HGBA1C in the last 72 hours. CBG: No results for input(s): GLUCAP in the last 168 hours. Lipid Profile: No results for input(s): CHOL, HDL, LDLCALC, TRIG, CHOLHDL, LDLDIRECT in the last 72 hours. Thyroid Function Tests: No results for input(s): TSH, T4TOTAL, FREET4, T3FREE, THYROIDAB in the last 72 hours. Anemia Panel: No results for input(s): VITAMINB12, FOLATE, FERRITIN, TIBC, IRON, RETICCTPCT in the last 72 hours. Urine analysis: No results found for: COLORURINE, APPEARANCEUR, LABSPEC, PHURINE, GLUCOSEU, HGBUR, BILIRUBINUR, KETONESUR, PROTEINUR, UROBILINOGEN, NITRITE, LEUKOCYTESUR Sepsis Labs: @LABRCNTIP (procalcitonin:4,lacticidven:4) ) Recent Results (from the past 240 hour(s))  Resp Panel by RT-PCR (Flu A&B, Covid) Nasopharyngeal Swab     Status: None   Collection Time: 11/28/20  9:23 PM   Specimen: Nasopharyngeal Swab; Nasopharyngeal(NP) swabs in vial transport medium  Result Value Ref Range Status   SARS Coronavirus 2 by RT PCR NEGATIVE NEGATIVE Final    Comment: (NOTE) SARS-CoV-2 target nucleic acids are NOT DETECTED.  The SARS-CoV-2 RNA is generally detectable in upper respiratory specimens during the acute phase of infection. The lowest concentration of SARS-CoV-2 viral copies this assay can detect is 138 copies/mL. A  negative result does not preclude SARS-Cov-2 infection and should not be used as the sole basis for treatment or other patient management decisions. A negative result may occur with  improper specimen collection/handling, submission of specimen other than nasopharyngeal swab, presence of viral mutation(s) within the areas targeted by this assay, and inadequate number of viral copies(<138 copies/mL). A negative result must be combined with clinical observations, patient history, and epidemiological information. The expected result is Negative.  Fact Sheet for Patients:  11/30/20  Fact Sheet for Healthcare Providers:  BloggerCourse.com  This test is no t yet approved or cleared by the [place] FDA and  has been authorized for detection and/or diagnosis of SARS-CoV-2 by FDA under an Emergency Use Authorization (EUA). This EUA will remain  in effect (meaning this test can be used) for the duration of the COVID-19 declaration under Section 564(b)(1) of the Act, 21 U.S.C.section 360bbb-3(b)(1), unless the authorization is terminated  or revoked sooner.       Influenza A by PCR NEGATIVE NEGATIVE Final   Influenza B by PCR NEGATIVE NEGATIVE Final    Comment: (NOTE) The Xpert Xpress SARS-CoV-2/FLU/RSV plus assay is intended  as an aid in the diagnosis of influenza from Nasopharyngeal swab specimens and should not be used as a sole basis for treatment. Nasal washings and aspirates are unacceptable for Xpert Xpress SARS-CoV-2/FLU/RSV testing.  Fact Sheet for Patients: BloggerCourse.com  Fact Sheet for Healthcare Providers: SeriousBroker.it  This test is not yet approved or cleared by the [place] FDA and has been authorized for detection and/or diagnosis of SARS-CoV-2 by FDA under an Emergency Use Authorization (EUA). This EUA will remain in effect (meaning this test can  be used) for the duration of the COVID-19 declaration under Section 564(b)(1) of the Act, 21 U.S.C. section 360bbb-3(b)(1), unless the authorization is terminated or revoked.  Performed at [facility], [address] [place], [place] [address]      Radiological Exams on Admission: MR Brain W and Wo Contrast  Result Date: 11/28/2020 CLINICAL DATA:  eye palsy and visual deficit; visual deficits. Diplopia for 3 days. EXAM: MRI HEAD AND ORBITS WITHOUT AND WITH CONTRAST TECHNIQUE: Multiplanar, multiecho pulse sequences of the brain and surrounding structures were obtained without and with intravenous contrast. Multiplanar, multiecho pulse sequences of the orbits and surrounding structures were obtained including fat saturation techniques, before and after intravenous contrast administration. CONTRAST:  6.60mL GADAVIST GADOBUTROL 1 MMOL/ML IV SOLN COMPARISON:  None. FINDINGS: MRI HEAD FINDINGS Brain: There is a 2 cm acute to early subacute infarct in the left midbrain and thalamus. Additional punctate acute/subacute infarcts are noted in the right thalamus and left middle frontal gyrus. There is a 3 mm focus of cortical enhancement posteriorly in the left temporal lobe without corresponding T2 or diffusion-weighted signal abnormality (series 15, image 18 and series 16, image 10). No abnormal intracranial enhancement is seen elsewhere. No intracranial hemorrhage, mass, midline shift, or extra-axial fluid collection is identified. There is borderline mild cerebral atrophy. No significant chronic cerebral white matter disease is evident. 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 Vascular: Major intracranial vascular flow voids are preserved. Skull and upper cervical spine: Unremarkable bone marrow signal. Other: None. MRI ORBITS FINDINGS Orbits: The globes appear intact. No optic nerve edema or abnormal enhancement is identified. No orbital mass or  inflammation is evident. The extraocular muscles and lacrimal glands are unremarkable. Visualized sinuses: Minimal bilateral ethmoid air cell mucosal thickening. Clear mastoid air cells. Soft tissues: Unremarkable. IMPRESSION: 1. Acute to early subacute infarcts in the left midbrain, thalami, and left frontal lobe. 2. 3 mm focus of cortical enhancement in the left temporal lobe without edema or diffusion abnormality. This is indeterminate but could reflect a subacute infarct or be vascular. Consider a follow-up MRI in 2-3 months to assess for resolution or persistence/progression. 3. Tiny chronic cerebellar infarcts. 4. Unremarkable appearance of the orbits. Electronically Signed   By: Sebastian Ache M.D.   On: 11/28/2020 20:40   MR ORBITS W WO CONTRAST  Result Date: 11/28/2020 CLINICAL DATA:  eye palsy and visual deficit; visual deficits. Diplopia for 3 days. EXAM: MRI HEAD AND ORBITS WITHOUT AND WITH CONTRAST TECHNIQUE: Multiplanar, multiecho pulse sequences of the brain and surrounding structures were obtained without and with intravenous contrast. Multiplanar, multiecho pulse sequences of the orbits and surrounding structures were obtained including fat saturation techniques, before and after intravenous contrast administration. CONTRAST:  6.41mL GADAVIST GADOBUTROL 1 MMOL/ML IV SOLN COMPARISON:  None. FINDINGS: MRI HEAD FINDINGS Brain: There is a 2 cm acute to early subacute infarct in the left midbrain and thalamus. Additional punctate acute/subacute  infarcts are noted in the right thalamus and left middle frontal gyrus. There is a 3 mm focus of cortical enhancement posteriorly in the left temporal lobe without corresponding T2 or diffusion-weighted signal abnormality (series 15, image 18 and series 16, image 10). No abnormal intracranial enhancement is seen elsewhere. No intracranial hemorrhage, mass, midline shift, or extra-axial fluid collection is identified. There is borderline mild cerebral atrophy. No  significant chronic cerebral white matter disease is evident. 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 Vascular: Major intracranial vascular flow voids are preserved. Skull and upper cervical spine: Unremarkable bone marrow signal. Other: None. MRI ORBITS FINDINGS Orbits: The globes appear intact. No optic nerve edema or abnormal enhancement is identified. No orbital mass or inflammation is evident. The extraocular muscles and lacrimal glands are unremarkable. Visualized sinuses: Minimal bilateral ethmoid air cell mucosal thickening. Clear mastoid air cells. Soft tissues: Unremarkable. IMPRESSION: 1. Acute to early subacute infarcts in the left midbrain, thalami, and left frontal lobe. 2. 3 mm focus of cortical enhancement in the left temporal lobe without edema or diffusion abnormality. This is indeterminate but could reflect a subacute infarct or be vascular. Consider a follow-up MRI in 2-3 months to assess for resolution or persistence/progression. 3. Tiny chronic cerebellar infarcts. 4. Unremarkable appearance of the orbits. Electronically Signed   By: Sebastian Ache M.D.   On: 11/28/2020 20:40    EKG: Independently reviewed. Sinus rhythm.   Assessment/Plan  1. Ischemic CVA  - 50 yr old lady who denies PMHx p/w 3 days diplopia and fatigue and has acute to early subacute ischemic CVA involving Lt midbrain, thalami, and Lt frontal lobe on MRI  - Neurology consulted by ED and much appreciated  - Continue cardiac monitoring and neuro checks, check echocardiogram, A1c, and lipids, consult PT/OT/SLP, and follow-up neurology recommendations     DVT prophylaxis: Lovenox  Code Status: Full  Level of Care: Level of care: Telemetry Medical Family Communication: Daughter updated at bedside  Disposition Plan:  Patient is from: home  Anticipated d/c is to: Home  Anticipated d/c date is: 11/22 or 11/30/20  Patient currently: Pending stroke w/u and  therapy assessments  Consults called: neurology  Admission status: Observation     Briscoe Deutscher, MD [facility]  11/29/2020, 12:18 AM

## 2020-11-28 NOTE — Discharge Instructions (Addendum)
Please drive to [facility] emergency department for MRI of your brain today.

## 2020-11-29 ENCOUNTER — Encounter (HOSPITAL_COMMUNITY): Payer: Self-pay | Admitting: Family Medicine

## 2020-11-29 ENCOUNTER — Inpatient Hospital Stay (HOSPITAL_COMMUNITY): Payer: 59

## 2020-11-29 ENCOUNTER — Inpatient Hospital Stay (HOSPITAL_COMMUNITY): Payer: No Typology Code available for payment source

## 2020-11-29 DIAGNOSIS — I351 Nonrheumatic aortic (valve) insufficiency: Secondary | ICD-10-CM | POA: Diagnosis not present

## 2020-11-29 DIAGNOSIS — I639 Cerebral infarction, unspecified: Secondary | ICD-10-CM | POA: Diagnosis present

## 2020-11-29 DIAGNOSIS — I253 Aneurysm of heart: Secondary | ICD-10-CM | POA: Diagnosis present

## 2020-11-29 DIAGNOSIS — I6389 Other cerebral infarction: Secondary | ICD-10-CM

## 2020-11-29 DIAGNOSIS — Z89022 Acquired absence of left finger(s): Secondary | ICD-10-CM | POA: Diagnosis not present

## 2020-11-29 DIAGNOSIS — Z8673 Personal history of transient ischemic attack (TIA), and cerebral infarction without residual deficits: Secondary | ICD-10-CM | POA: Diagnosis not present

## 2020-11-29 DIAGNOSIS — E785 Hyperlipidemia, unspecified: Secondary | ICD-10-CM | POA: Diagnosis present

## 2020-11-29 DIAGNOSIS — Q2112 Patent foramen ovale: Secondary | ICD-10-CM | POA: Diagnosis not present

## 2020-11-29 DIAGNOSIS — I63139 Cerebral infarction due to embolism of unspecified carotid artery: Secondary | ICD-10-CM | POA: Diagnosis not present

## 2020-11-29 DIAGNOSIS — Z8249 Family history of ischemic heart disease and other diseases of the circulatory system: Secondary | ICD-10-CM | POA: Diagnosis not present

## 2020-11-29 DIAGNOSIS — H532 Diplopia: Secondary | ICD-10-CM | POA: Diagnosis present

## 2020-11-29 DIAGNOSIS — Z20822 Contact with and (suspected) exposure to covid-19: Secondary | ICD-10-CM | POA: Diagnosis present

## 2020-11-29 DIAGNOSIS — I6340 Cerebral infarction due to embolism of unspecified cerebral artery: Secondary | ICD-10-CM | POA: Diagnosis present

## 2020-11-29 DIAGNOSIS — I10 Essential (primary) hypertension: Secondary | ICD-10-CM | POA: Diagnosis present

## 2020-11-29 DIAGNOSIS — R42 Dizziness and giddiness: Secondary | ICD-10-CM | POA: Diagnosis present

## 2020-11-29 DIAGNOSIS — E872 Acidosis, unspecified: Secondary | ICD-10-CM | POA: Diagnosis present

## 2020-11-29 DIAGNOSIS — R29700 NIHSS score 0: Secondary | ICD-10-CM | POA: Diagnosis present

## 2020-11-29 DIAGNOSIS — Z79899 Other long term (current) drug therapy: Secondary | ICD-10-CM | POA: Diagnosis not present

## 2020-11-29 LAB — COMPREHENSIVE METABOLIC PANEL
ALT: 27 U/L (ref 0–44)
AST: 21 U/L (ref 15–41)
Albumin: 3.5 g/dL (ref 3.5–5.0)
Alkaline Phosphatase: 74 U/L (ref 38–126)
Anion gap: 8 (ref 5–15)
BUN: 13 mg/dL (ref 6–20)
CO2: 18 mmol/L — ABNORMAL LOW (ref 22–32)
Calcium: 9.4 mg/dL (ref 8.9–10.3)
Chloride: 111 mmol/L (ref 98–111)
Creatinine, Ser: 0.97 mg/dL (ref 0.44–1.00)
GFR, Estimated: >60 mL/min (ref >60)
Glucose, Bld: 91 mg/dL (ref 70–99)
Potassium: 3.8 mmol/L (ref 3.5–5.1)
Sodium: 137 mmol/L (ref 135–145)
Total Bilirubin: 1.7 mg/dL — ABNORMAL HIGH (ref 0.3–1.2)
Total Protein: 7.2 g/dL (ref 6.5–8.1)

## 2020-11-29 LAB — CBC
HCT: 43.1 % (ref 36.0–46.0)
Hemoglobin: 13.7 g/dL (ref 12.0–15.0)
MCH: 31 pg (ref 26.0–34.0)
MCHC: 31.8 g/dL (ref 30.0–36.0)
MCV: 97.5 fL (ref 80.0–100.0)
Platelets: 236 10*3/uL (ref 150–400)
RBC: 4.42 MIL/uL (ref 3.87–5.11)
RDW: 13.3 % (ref 11.5–15.5)
WBC: 10.6 10*3/uL — ABNORMAL HIGH (ref 4.0–10.5)
nRBC: 0 % (ref 0.0–0.2)

## 2020-11-29 LAB — LIPID PANEL
Cholesterol: 177 mg/dL (ref 0–200)
HDL: 35 mg/dL — ABNORMAL LOW (ref >40)
LDL Cholesterol: 117 mg/dL — ABNORMAL HIGH (ref 0–99)
Total CHOL/HDL Ratio: 5.1 RATIO
Triglycerides: 123 mg/dL (ref <150)
VLDL: 25 mg/dL (ref 0–40)

## 2020-11-29 LAB — ECHOCARDIOGRAM COMPLETE
Area-P 1/2: 2.7 cm2
Calc EF: 71.7 %
P 1/2 time: 600 msec
S' Lateral: 2.2 cm
Single Plane A2C EF: 73.5 %
Single Plane A4C EF: 70.3 %

## 2020-11-29 LAB — TSH: TSH: 3.244 u[IU]/mL (ref 0.350–4.500)

## 2020-11-29 LAB — HEMOGLOBIN A1C
Hgb A1c MFr Bld: 4.9 % (ref 4.8–5.6)
Mean Plasma Glucose: 93.93 mg/dL

## 2020-11-29 LAB — ANTITHROMBIN III: AntiThromb III Func: 119 % (ref 75–120)

## 2020-11-29 LAB — HIV ANTIBODY (ROUTINE TESTING W REFLEX): HIV Screen 4th Generation wRfx: NONREACTIVE

## 2020-11-29 LAB — C-REACTIVE PROTEIN: CRP: 0.8 mg/dL (ref <1.0)

## 2020-11-29 LAB — SEDIMENTATION RATE: Sed Rate: 33 mm/hr — ABNORMAL HIGH (ref 0–22)

## 2020-11-29 MED ORDER — CLOPIDOGREL BISULFATE 75 MG PO TABS
75.0000 mg | ORAL_TABLET | Freq: Every day | ORAL | Status: DC
  Administered 2020-11-29 – 2020-12-05 (×7): 75 mg via ORAL
  Filled 2020-11-29 (×7): qty 1

## 2020-11-29 MED ORDER — ATORVASTATIN CALCIUM 40 MG PO TABS
40.0000 mg | ORAL_TABLET | Freq: Every day | ORAL | Status: DC
  Administered 2020-11-29 – 2020-12-05 (×7): 40 mg via ORAL
  Filled 2020-11-29 (×6): qty 1

## 2020-11-29 MED ORDER — ASPIRIN EC 81 MG PO TBEC
81.0000 mg | DELAYED_RELEASE_TABLET | Freq: Every day | ORAL | Status: DC
  Administered 2020-11-29 – 2020-12-05 (×7): 81 mg via ORAL
  Filled 2020-11-29 (×7): qty 1

## 2020-11-29 MED ORDER — SODIUM CHLORIDE 0.9 % IV SOLN: INTRAVENOUS | Status: DC

## 2020-11-29 MED ORDER — IOHEXOL 350 MG/ML SOLN
75.0000 mL | Freq: Once | INTRAVENOUS | Status: AC | PRN
  Administered 2020-11-29: 75 mL via INTRAVENOUS

## 2020-11-29 NOTE — H&P (View-Only) (Signed)
STROKE TEAM PROGRESS NOTE   INTERVAL HISTORY Traveled to [place] 2 weeks ago. On 11/20 she noted onset of diaphoresis, drowsiness, double vision, lightheadedness and dizziness for which she sought medical help in [facility]. Double vision went away when she closed one eye. She had a head CT and saw a neurologist there who told her she had nerve problems in her eyes. She flew back on Sunday 11/21. Then reported to ED here due to persistent double vision. Stopped BCPs on 11/20. Denies smoking. Denies miscarriages or previous clot history. No family history of early stroke.  We discussed her stroke diagnosis, possible etiologies and ongoing work up for source including imaging, procedures (TCD bubble), echo), and hypercoaguable labs. Possible TEE also discussed.  Daughter is at bedside. Questions were answered.   Vitals:   11/29/20 1044 11/29/20 1046 11/29/20 1049 11/29/20 1243  BP: 123/78 118/83 103/90 111/79  Pulse: (!) 58 97 (!) 109 87  Resp:    20  Temp:    97.9 F (36.6 C)  TempSrc:    Oral  SpO2: 100% 100% 99% 100%  Weight:      Height:       CBC:  Recent Labs  Lab 11/28/20 1406 11/29/20 0346  WBC 9.5 10.6*  NEUTROABS 7.4  --   HGB 13.6 13.7  HCT 40.7 43.1  MCV 94.0 97.5  PLT 234 236   Basic Metabolic Panel:  Recent Labs  Lab 11/28/20 1406 11/29/20 0346  NA 137 137  K 3.8 3.8  CL 112* 111  CO2 17* 18*  GLUCOSE 91 91  BUN 9 13  CREATININE 0.95 0.97  CALCIUM 8.9 9.4   Lipid Panel:  Recent Labs  Lab 11/29/20 0346  CHOL 177  TRIG 123  HDL 35*  CHOLHDL 5.1  VLDL 25  LDLCALC 315*   HgbA1c:  Recent Labs  Lab 11/29/20 0346  HGBA1C 4.9   Urine Drug Screen: No results for input(s): LABOPIA, COCAINSCRNUR, LABBENZ, AMPHETMU, THCU, LABBARB in the last 168 hours.  Alcohol Level No results for input(s): ETH in the last 168 hours.  IMAGING past 24 hours MR ANGIO HEAD WO CONTRAST  Result Date: 11/29/2020 CLINICAL DATA:  Neuro deficit, acute, stroke  suspected. EXAM: MRA HEAD WITHOUT CONTRAST TECHNIQUE: Angiographic images of the Circle of Willis were acquired using MRA technique without intravenous contrast. COMPARISON:  MRI of the brain November 28, 2020. FINDINGS: Anterior circulation: Mild luminal irregularity in the proximal petrous and paraclinoid segment may be related to intracranial atherosclerotic disease versus artifact without hemodynamically significant stenosis. Bilateral ACA and MCA vascular trees are preserved. Posterior circulation: Signal loss of the proximal left PICA suggesting occlusion with normal flow related enhancement in its distal segments. The basilar artery has a relatively small caliber noting presence of prominent bilateral posterior communicating arteries. No hemodynamically significant stenosis of the basilar artery or posterior cerebral arteries. Anatomic variants: Prominent bilateral posterior communicating arteries. Other: None. 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 Electronically Signed   By: Baldemar Lenis M.D.   On: 11/29/2020 08:39   MR Brain W and Wo Contrast  Result Date: 11/28/2020 CLINICAL DATA:  eye palsy and visual deficit; visual deficits. Diplopia for 3 days. EXAM: MRI HEAD AND ORBITS WITHOUT AND WITH CONTRAST TECHNIQUE: Multiplanar, multiecho pulse sequences of the brain and surrounding structures were obtained without and with intravenous contrast. Multiplanar, multiecho pulse sequences of the orbits and surrounding structures  were obtained including fat saturation techniques, before and after intravenous contrast administration. CONTRAST:  6.64mL GADAVIST GADOBUTROL 1 MMOL/ML IV SOLN COMPARISON:  None. FINDINGS: MRI HEAD FINDINGS Brain: There is a 2 cm acute to early subacute infarct in the left midbrain and thalamus. Additional punctate acute/subacute infarcts are noted in the right thalamus and left middle  frontal gyrus. There is a 3 mm focus of cortical enhancement posteriorly in the left temporal lobe without corresponding T2 or diffusion-weighted signal abnormality (series 15, image 18 and series 16, image 10). No abnormal intracranial enhancement is seen elsewhere. No intracranial hemorrhage, mass, midline shift, or extra-axial fluid collection is identified. There is borderline mild cerebral atrophy. No significant chronic cerebral white matter disease is evident. 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 Vascular: Major intracranial vascular flow voids are preserved. Skull and upper cervical spine: Unremarkable bone marrow signal. Other: None. MRI ORBITS FINDINGS Orbits: The globes appear intact. No optic nerve edema or abnormal enhancement is identified. No orbital mass or inflammation is evident. The extraocular muscles and lacrimal glands are unremarkable. Visualized sinuses: Minimal bilateral ethmoid air cell mucosal thickening. Clear mastoid air cells. Soft tissues: Unremarkable. IMPRESSION: 1. Acute to early subacute infarcts in the left midbrain, thalami, and left frontal lobe. 2. 3 mm focus of cortical enhancement in the left temporal lobe without edema or diffusion abnormality. This is indeterminate but could reflect a subacute infarct or be vascular. Consider a follow-up MRI in 2-3 months to assess for resolution or persistence/progression. 3. Tiny chronic cerebellar infarcts. 4. Unremarkable appearance of the orbits. Electronically Signed   By: Logan Bores M.D.   On: 11/28/2020 20:40   CT ANGIO HEAD NECK W WO CM (CODE STROKE)  Result Date: 11/29/2020 CLINICAL DATA:  Stroke follow-up EXAM: CT ANGIOGRAPHY HEAD AND NECK TECHNIQUE: Multidetector CT imaging of the head and neck was performed using the standard protocol during bolus administration of intravenous contrast. Multiplanar CT image reconstructions and MIPs were obtained to evaluate  the vascular anatomy. Carotid stenosis measurements (when applicable) are obtained utilizing NASCET criteria, using the distal internal carotid diameter as the denominator. CONTRAST:  37mL OMNIPAQUE IOHEXOL 350 MG/ML SOLN COMPARISON:  Same-day MRA head, MR brain and orbits 11/28/2020 FINDINGS: CT HEAD FINDINGS Brain: Hypodensity in the left thalamus extending to the midbrain is consistent with evolving infarct as seen on the prior brain MRI. The additional small infarcts in the right thalamus is not well seen. There is no new acute territorial infarct. There is no acute intracranial hemorrhage. The ventricles are stable in size. There is no solid mass lesion. There is no midline shift. Vascular: No hyperdense vessel or unexpected calcification. Skull: Normal. Negative for fracture or focal lesion. Sinuses: The paranasal sinuses are clear. Orbits: The globes and orbits are unremarkable. Review of the MIP images confirms the above findings CTA NECK FINDINGS Aortic arch: Standard branching. Imaged portion shows no evidence of aneurysm or dissection. No significant stenosis of the major arch vessel origins. Right carotid system: The right common, internal, and external carotid arteries are patent, without hemodynamically significant stenosis, occlusion, dissection, or aneurysm. Left carotid system: The left common, internal, and external carotid arteries are patent, without hemodynamically significant stenosis, occlusion, dissection, or aneurysm. Vertebral arteries: The proximal right V2 segment is suboptimally evaluated due to significant streak artifact from adjacent venous contamination. The distal V2 and V3 segments are patent, without hemodynamically significant stenosis, occlusion, dissection, or aneurysm. The left vertebral artery is  patent, without hemodynamically significant stenosis, occlusion, dissection, or aneurysm. Skeleton: There is no acute osseous abnormality or aggressive osseous lesion. There is no  visible canal hematoma. Other neck: The soft tissues are unremarkable. Upper chest: The lung apices are clear. Review of the MIP images confirms the above findings CTA HEAD FINDINGS Anterior circulation: The intracranial ICAs are patent. The bilateral MCAs are patent. The bilateral ACAs are patent. There is no aneurysm. Posterior circulation: The bilateral V4 segments are patent. The PICA origins are patent bilaterally. The SCAs are patent bilaterally. The basilar artery is patent. The bilateral PCAs are patent. Bilateral posterior communicating arteries are identified. Venous sinuses: Patent. Anatomic variants: None. Review of the MIP images confirms the above findings IMPRESSION: 1. Expected evolution of the early subacute infarct in the left thalamus extending to the midbrain. The additional small infarcts in the right thalamus is not well seen. 2. Patent vasculature of the head and neck, with no stenosis, occlusion, or dissection. The bilateral PICA origins are patent. Electronically Signed   By: Valetta Mole M.D.   On: 11/29/2020 10:51   MR ORBITS W WO CONTRAST  Result Date: 11/28/2020 CLINICAL DATA:  eye palsy and visual deficit; visual deficits. Diplopia for 3 days. EXAM: MRI HEAD AND ORBITS WITHOUT AND WITH CONTRAST TECHNIQUE: Multiplanar, multiecho pulse sequences of the brain and surrounding structures were obtained without and with intravenous contrast. Multiplanar, multiecho pulse sequences of the orbits and surrounding structures were obtained including fat saturation techniques, before and after intravenous contrast administration. CONTRAST:  6.41mL GADAVIST GADOBUTROL 1 MMOL/ML IV SOLN COMPARISON:  None. FINDINGS: MRI HEAD FINDINGS Brain: There is a 2 cm acute to early subacute infarct in the left midbrain and thalamus. Additional punctate acute/subacute infarcts are noted in the right thalamus and left middle frontal gyrus. There is a 3 mm focus of cortical enhancement posteriorly in the left  temporal lobe without corresponding T2 or diffusion-weighted signal abnormality (series 15, image 18 and series 16, image 10). No abnormal intracranial enhancement is seen elsewhere. No intracranial hemorrhage, mass, midline shift, or extra-axial fluid collection is identified. There is borderline mild cerebral atrophy. No significant chronic cerebral white matter disease is evident. 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 Vascular: Major intracranial vascular flow voids are preserved. Skull and upper cervical spine: Unremarkable bone marrow signal. Other: None. MRI ORBITS FINDINGS Orbits: The globes appear intact. No optic nerve edema or abnormal enhancement is identified. No orbital mass or inflammation is evident. The extraocular muscles and lacrimal glands are unremarkable. Visualized sinuses: Minimal bilateral ethmoid air cell mucosal thickening. Clear mastoid air cells. Soft tissues: Unremarkable. IMPRESSION: 1. Acute to early subacute infarcts in the left midbrain, thalami, and left frontal lobe. 2. 3 mm focus of cortical enhancement in the left temporal lobe without edema or diffusion abnormality. This is indeterminate but could reflect a subacute infarct or be vascular. Consider a follow-up MRI in 2-3 months to assess for resolution or persistence/progression. 3. Tiny chronic cerebellar infarcts. 4. Unremarkable appearance of the orbits. Electronically Signed   By: Logan Bores M.D.   On: 11/28/2020 20:40    PHYSICAL EXAM Pleasant middle-aged Hispanic lady not in distress. . Afebrile. Head is nontraumatic. Neck is supple without bruit.    Cardiac exam no murmur or gallop. Lungs are clear to auscultation. Distal pulses are well felt.  Neurological Exam ;  Awake  Alert oriented x 3. Normal speech and language.Marland Kitchen  Restriction of vertical gaze  but preserved horizontal gaze without nystagmus.subjective vertical diplopia.  Fundi were not visualized.  Vision acuity and fields appear normal. Hearing is normal. Palatal movements are normal. Face symmetric. Tongue midline. Normal strength, tone, reflexes and coordination. Normal sensation. Gait deferred.  ASSESSMENT/PLAN Ms. Alexa Hatfield is a 50 y.o. 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 Symptoms onsent 11/19 while in the [place] with [place]iness along with double vision.  She was seen there and reportedly had a negative CT scan and a neurology evaluation which was not concerning for stroke but was advised to stop BCPs. She flew back 11/21. Reported to urgent care then ED where MR brain with and without showed acute to early subacute infarct in the left midbrain, bilateral thalami and left frontal lobe.       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   Code Stroke CT head: not obtained.  MR brain and orbits w/wo:  Acute to early subacute infarcts in the left midbrain, thalami, and left frontal lobe. 3 mm focus of cortical enhancement in the left temporal lobe without edema or diffusion abnormality. This is indeterminate but could reflect a subacute infarct or be vascular  Tiny chronic cerebellar infarcts. Unremarkable appearance of the orbits MRA brain Suspected occlusion of the proximal left PICA with normal flow related enhancement of its distal segment. 2D Echo PENDING TCD bubble PENDING Carotid US PENDING Hypercoag panel is PENDING LDL 117 HgbA1c 4.9 VTE prophylaxis - is recommended, per primary team     Diet   Diet Heart Room service appropriate? Yes; Fluid consistency: Thin   No anticoagulant or antiplatelet prior to admission Now on ASA 81mg  and Plavix  75mg  daily pending work up Therapy recommendations:  Outpatient OT/Pending Disposition:  TBD  BP management Stable Permissive hypertension (OK if < 220/120) but gradually normalize in 5-7 days Long-term BP goal normotensive  Hyperlipidemia Home meds:  none LDL 117, goal < 70 High intensity statin Lipitor 40mg  Continue statin at discharge  Other Stroke Risk Factors UDS is pending (discussed with RN x2)  Other [facility] day # 0  Delila A Bailey-Modzik, NP-C I have personally obtained history,examined this patient, reviewed notes, independently viewed imaging studies, participated in medical decision making and plan of care.ROS completed by me personally and pertinent positives fully documented  I have made any additions or clarifications directly to the above note. Agree with note above.  Patient presented with sudden onset of diplopia and dizziness-year-old bilateral medial thalamic left greater than right as well as small left frontal and parietal temporal infarct likely of central cardiogenic etiology.  Recommend continue ongoing stroke work-up by checking echocardiogram, transplant Doppler bubble study for PFO, TEE tomorrow and lab work for hypercoagulability, vasculitis and prolonged cardiac monitoring after discharge for paroxysmal A. fib.  Patient counseled to use an eye patch.  She was advised to discontinue birth control pills assessment: Hypercoagulability Surgeries.  Dual antiplatelet therapy for 3 weeks followed by aspirin alone.  Aggressive risk factor modification.  Greater than 50% time during this 35-minute visit was spent on counseling and coordination of care about her embolic stroke and answering questions.  Antony Contras, MD Medical Director [facility] Pager: [phone] 11/29/2020 3:44  PM   To contact Stroke Continuity provider, please refer to [email or web address] After hours, contact General Neurology

## 2020-11-29 NOTE — Evaluation (Addendum)
Occupational Therapy Evaluation Patient Details Name: Alexa Hatfield MRN: [id number] DOB: 1970-11-07 Today's Date: 11/29/2020   History of Present Illness 50 yo admitted with double vision and fatigue. 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 PMH: complicated Liviu; finger amputation   Clinical Impression   PTA pt lives at home with her son and mother, drives and works full-time in a [employer] operational officer". [employer] Interpreter used during session. Pt appears to be complaining of vertical diplopia at all times. Nasal field of R eye partially occluded on reading glasses and non-prescription glasses to reduce double image and improve functional vision. Pt reports glasses help significantly. Pt with unsteady gait but does not report any falls since having double vision. Will follow acutely to facilitate safe DC home and recommend follow up with her eye doctor and with OT at the neuro outpt center.      Recommendations for follow up therapy are one component of a multi-disciplinary discharge planning process, led by the attending physician.  Recommendations may be updated based on patient status, additional functional criteria and insurance authorization.   Follow Up Recommendations  Outpatient OT (neuro outpt)    Assistance Recommended at Discharge Intermittent Supervision/Assistance  Functional Status Assessment  Patient has had a recent decline in their functional status and demonstrates the ability to make significant improvements in function in a reasonable and predictable amount of time.  Equipment Recommendations  BSC/3in1 (to use as shower seat)    Recommendations for Other Services       Precautions / Restrictions Precautions Precautions: Fall      Mobility Bed Mobility Overal bed mobility: Modified Independent                  Transfers Overall transfer level:  Needs assistance   Transfers: Sit to/from Stand Sit to Stand: Min guard                  Balance Overall balance assessment: Needs assistance   Sitting balance-Leahy Scale: Good       Standing balance-Leahy Scale: Fair                             ADL either performed or assessed with clinical judgement   ADL Overall ADL's : Needs assistance/impaired Eating/Feeding: Modified independent   Grooming: Set up   Upper Body Bathing: Set up;Sitting   Lower Body Bathing: Min guard;Sit to/from stand   Upper Body Dressing : Set up;Sitting   Lower Body Dressing: Min guard;Sit to/from stand   Toilet Transfer: Minimal Dentist Details (indicate cue type and reason): unsteady Toileting- Clothing Manipulation and Hygiene: Set up       Functional mobility during ADLs: Minimal assistance       Vision Baseline Vision/History: 1 Wears glasses (reading) Patient Visual Report: Diplopia Vision Assessment?: Yes Eye Alignment: Impaired (comment) Ocular Range of Motion: Restricted looking up;Restricted looking down Tracking/Visual Pursuits: Decreased smoothness of vertical tracking;Impaired - to be further tested in functional context Saccades: Additional head turns occurred during testing Visual Fields:  (will further assess) Diplopia Assessment: Objects split on top of one another;Present all the time/all directions Depth Perception: Overshoots;Undershoots   Appears L eye dominant; brings roll of tape to L eye and is closing R eye to see  Perception     Praxis  Pertinent Vitals/Pain Pain Assessment: No/denies pain     Hand Dominance Right   Extremity/Trunk Assessment Upper Extremity Assessment Upper Extremity Assessment: Overall WFL for tasks assessed (L partial amputation of ring finger 2020)   Lower Extremity Assessment Lower Extremity Assessment: Defer to PT evaluation   Cervical / Trunk Assessment Cervical / Trunk Assessment:  Normal   Communication Communication Communication: Prefers language other than English   Cognition Arousal/Alertness: Awake/alert Behavior During Therapy: Flat affect Overall Cognitive Status: No family/caregiver present to determine baseline cognitive functioning                                 General Comments: slow processing; will further assess     General Comments  [employer]" interpreter used during session    Exercises     Shoulder Instructions      Home Living Family/patient expects to be discharged to:: Private residence Living Arrangements: Parent;Children Available Help at Discharge: Available PRN/intermittently Type of Home: House Home Access: Stairs to enter Entrance Stairs-Number of Steps: 5 Entrance Stairs-Rails: Right Home Layout: One level     Bathroom Shower/Tub: Producer, television/film/video: Standard Bathroom Accessibility: Yes How Accessible: Accessible via walker Home Equipment: None          Prior Functioning/Environment Prior Level of Function : Independent/Modified Independent;Working/employed             Mobility Comments: works in Geologist, 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"          OT Problem List: Decreased activity tolerance;Impaired balance (sitting and/or standing);Impaired vision/perception;Decreased safety awareness;Decreased knowledge of use of DME or AE;Decreased strength;Decreased range of motion      OT Treatment/Interventions: Self-care/ADL training;Therapeutic exercise;DME and/or AE instruction;Therapeutic activities;Visual/perceptual remediation/compensation;Cognitive remediation/compensation;Patient/family education;Balance training    OT Goals(Current goals can be found in the care plan section) Acute Rehab OT Goals Patient Stated Goal: for her vision to get better OT Goal Formulation: With patient Time For Goal Achievement: 12/13/20 Potential to Achieve Goals: Good  OT  Frequency: Min 2X/week   Barriers to D/C:            Co-evaluation              AM-PAC OT "6 Clicks" Daily Activity     Outcome Measure Help from another person eating meals?: None Help from another person taking care of personal grooming?: A Little Help from another person toileting, which includes using toliet, bedpan, or urinal?: A Little Help from another person bathing (including washing, rinsing, drying)?: A Little Help from another person to put on and taking off regular upper body clothing?: A Little Help from another person to put on and taking off regular lower body clothing?: A Little 6 Click Score: 19   End of Session Equipment Utilized During Treatment: Gait belt Nurse Communication: Other (comment);Mobility status (use of partial occlusion)  Activity Tolerance: Patient tolerated treatment well Patient left: in bed;with call bell/phone within reach;Other (comment) (pt leaving for CT)  OT Visit Diagnosis: Unsteadiness on feet (R26.81);Muscle weakness (generalized) (M62.81);Other abnormalities of gait and mobility (R26.89);Low vision, both eyes (H54.2);Other symptoms and signs involving cognitive function                Time: 3614-4315 OT Time Calculation (min): 39 min Charges:  OT General Charges $OT Visit: 1 Visit OT Evaluation $OT Eval Moderate Complexity: 1 Mod OT Treatments $Self Care/Home Management : 8-22 mins $Therapeutic Activity:  8-22 mins  Luisa Dago, OT/L   Acute OT Clinical Specialist Acute Rehabilitation Services Pager [phone] Office [phone]   [facility] 11/29/2020, 11:02 AM

## 2020-11-29 NOTE — Progress Notes (Signed)
    [facility] has been requested to perform a transesophageal echocardiogram on 11/23 for Stroke.  After careful review of history and examination, the risks and benefits of transesophageal echocardiogram have been explained including risks of esophageal damage, perforation (1:10,000 risk), bleeding, pharyngeal hematoma as well as other potential complications associated with conscious sedation including aspiration, arrhythmia, respiratory failure and death. Alternatives to treatment were discussed, questions were answered. Patient is willing to proceed. This was discussed with the patient and daughter at the bedside.   Laverda Page, NP-C 11/29/2020 5:20 PM

## 2020-11-29 NOTE — Plan of Care (Signed)
  Problem: Education: Goal: Knowledge of disease or condition will improve Outcome: Progressing   Problem: Education: Goal: Knowledge of secondary prevention will improve (SELECT ALL) Outcome: Progressing   Problem: Coping: Goal: Will verbalize positive feelings about self Outcome: Progressing   Problem: Self-Care: Goal: Ability to participate in self-care as condition permits will improve Outcome: Progressing   Problem: Nutrition: Goal: Risk of aspiration will decrease Outcome: Progressing

## 2020-11-29 NOTE — ED Notes (Signed)
Breakfast order placed ?

## 2020-11-29 NOTE — ED Notes (Signed)
Neurology Provider at bedside. 

## 2020-11-29 NOTE — Progress Notes (Addendum)
STROKE TEAM PROGRESS NOTE   INTERVAL HISTORY Traveled to [place] 2 weeks ago. On 11/20 she noted onset of diaphoresis, drowsiness, double vision, lightheadedness and dizziness for which she sought medical help in [facility]. Double vision went away when she closed one eye. She had a head CT and saw a neurologist there who told her she had nerve problems in her eyes. She flew back on Sunday 11/21. Then reported to ED here due to persistent double vision. Stopped BCPs on 11/20. Denies smoking. Denies miscarriages or previous clot history. No family history of early stroke.  We discussed her stroke diagnosis, possible etiologies and ongoing work up for source including imaging, procedures (TCD bubble), echo), and hypercoaguable labs. Possible TEE also discussed.  Daughter is at bedside. Questions were answered.   Vitals:   11/29/20 1044 11/29/20 1046 11/29/20 1049 11/29/20 1243  BP: 123/78 118/83 103/90 111/79  Pulse: (!) 58 97 (!) 109 87  Resp:    20  Temp:    97.9 F (36.6 C)  TempSrc:    Oral  SpO2: 100% 100% 99% 100%  Weight:      Height:       CBC:  Recent Labs  Lab 11/28/20 1406 11/29/20 0346  WBC 9.5 10.6*  NEUTROABS 7.4  --   HGB 13.6 13.7  HCT 40.7 43.1  MCV 94.0 97.5  PLT 234 236   Basic Metabolic Panel:  Recent Labs  Lab 11/28/20 1406 11/29/20 0346  NA 137 137  K 3.8 3.8  CL 112* 111  CO2 17* 18*  GLUCOSE 91 91  BUN 9 13  CREATININE 0.95 0.97  CALCIUM 8.9 9.4   Lipid Panel:  Recent Labs  Lab 11/29/20 0346  CHOL 177  TRIG 123  HDL 35*  CHOLHDL 5.1  VLDL 25  LDLCALC 315*   HgbA1c:  Recent Labs  Lab 11/29/20 0346  HGBA1C 4.9   Urine Drug Screen: No results for input(s): LABOPIA, COCAINSCRNUR, LABBENZ, AMPHETMU, THCU, LABBARB in the last 168 hours.  Alcohol Level No results for input(s): ETH in the last 168 hours.  IMAGING past 24 hours MR ANGIO HEAD WO CONTRAST  Result Date: 11/29/2020 CLINICAL DATA:  Neuro deficit, acute, stroke  suspected. EXAM: MRA HEAD WITHOUT CONTRAST TECHNIQUE: Angiographic images of the Circle of Willis were acquired using MRA technique without intravenous contrast. COMPARISON:  MRI of the brain November 28, 2020. FINDINGS: Anterior circulation: Mild luminal irregularity in the proximal petrous and paraclinoid segment may be related to intracranial atherosclerotic disease versus artifact without hemodynamically significant stenosis. Bilateral ACA and MCA vascular trees are preserved. Posterior circulation: Signal loss of the proximal left PICA suggesting occlusion with normal flow related enhancement in its distal segments. The basilar artery has a relatively small caliber noting presence of prominent bilateral posterior communicating arteries. No hemodynamically significant stenosis of the basilar artery or posterior cerebral arteries. Anatomic variants: Prominent bilateral posterior communicating arteries. Other: None. 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 Electronically Signed   By: Baldemar Lenis M.D.   On: 11/29/2020 08:39   MR Brain W and Wo Contrast  Result Date: 11/28/2020 CLINICAL DATA:  eye palsy and visual deficit; visual deficits. Diplopia for 3 days. EXAM: MRI HEAD AND ORBITS WITHOUT AND WITH CONTRAST TECHNIQUE: Multiplanar, multiecho pulse sequences of the brain and surrounding structures were obtained without and with intravenous contrast. Multiplanar, multiecho pulse sequences of the orbits and surrounding structures  were obtained including fat saturation techniques, before and after intravenous contrast administration. CONTRAST:  6.28mL GADAVIST GADOBUTROL 1 MMOL/ML IV SOLN COMPARISON:  None. FINDINGS: MRI HEAD FINDINGS Brain: There is a 2 cm acute to early subacute infarct in the left midbrain and thalamus. Additional punctate acute/subacute infarcts are noted in the right thalamus and left middle  frontal gyrus. There is a 3 mm focus of cortical enhancement posteriorly in the left temporal lobe without corresponding T2 or diffusion-weighted signal abnormality (series 15, image 18 and series 16, image 10). No abnormal intracranial enhancement is seen elsewhere. No intracranial hemorrhage, mass, midline shift, or extra-axial fluid collection is identified. There is borderline mild cerebral atrophy. No significant chronic cerebral white matter disease is evident. 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 Vascular: Major intracranial vascular flow voids are preserved. Skull and upper cervical spine: Unremarkable bone marrow signal. Other: None. MRI ORBITS FINDINGS Orbits: The globes appear intact. No optic nerve edema or abnormal enhancement is identified. No orbital mass or inflammation is evident. The extraocular muscles and lacrimal glands are unremarkable. Visualized sinuses: Minimal bilateral ethmoid air cell mucosal thickening. Clear mastoid air cells. Soft tissues: Unremarkable. IMPRESSION: 1. Acute to early subacute infarcts in the left midbrain, thalami, and left frontal lobe. 2. 3 mm focus of cortical enhancement in the left temporal lobe without edema or diffusion abnormality. This is indeterminate but could reflect a subacute infarct or be vascular. Consider a follow-up MRI in 2-3 months to assess for resolution or persistence/progression. 3. Tiny chronic cerebellar infarcts. 4. Unremarkable appearance of the orbits. Electronically Signed   By: Logan Bores M.D.   On: 11/28/2020 20:40   CT ANGIO HEAD NECK W WO CM (CODE STROKE)  Result Date: 11/29/2020 CLINICAL DATA:  Stroke follow-up EXAM: CT ANGIOGRAPHY HEAD AND NECK TECHNIQUE: Multidetector CT imaging of the head and neck was performed using the standard protocol during bolus administration of intravenous contrast. Multiplanar CT image reconstructions and MIPs were obtained to evaluate  the vascular anatomy. Carotid stenosis measurements (when applicable) are obtained utilizing NASCET criteria, using the distal internal carotid diameter as the denominator. CONTRAST:  42mL OMNIPAQUE IOHEXOL 350 MG/ML SOLN COMPARISON:  Same-day MRA head, MR brain and orbits 11/28/2020 FINDINGS: CT HEAD FINDINGS Brain: Hypodensity in the left thalamus extending to the midbrain is consistent with evolving infarct as seen on the prior brain MRI. The additional small infarcts in the right thalamus is not well seen. There is no new acute territorial infarct. There is no acute intracranial hemorrhage. The ventricles are stable in size. There is no solid mass lesion. There is no midline shift. Vascular: No hyperdense vessel or unexpected calcification. Skull: Normal. Negative for fracture or focal lesion. Sinuses: The paranasal sinuses are clear. Orbits: The globes and orbits are unremarkable. Review of the MIP images confirms the above findings CTA NECK FINDINGS Aortic arch: Standard branching. Imaged portion shows no evidence of aneurysm or dissection. No significant stenosis of the major arch vessel origins. Right carotid system: The right common, internal, and external carotid arteries are patent, without hemodynamically significant stenosis, occlusion, dissection, or aneurysm. Left carotid system: The left common, internal, and external carotid arteries are patent, without hemodynamically significant stenosis, occlusion, dissection, or aneurysm. Vertebral arteries: The proximal right V2 segment is suboptimally evaluated due to significant streak artifact from adjacent venous contamination. The distal V2 and V3 segments are patent, without hemodynamically significant stenosis, occlusion, dissection, or aneurysm. The left vertebral artery is  patent, without hemodynamically significant stenosis, occlusion, dissection, or aneurysm. Skeleton: There is no acute osseous abnormality or aggressive osseous lesion. There is no  visible canal hematoma. Other neck: The soft tissues are unremarkable. Upper chest: The lung apices are clear. Review of the MIP images confirms the above findings CTA HEAD FINDINGS Anterior circulation: The intracranial ICAs are patent. The bilateral MCAs are patent. The bilateral ACAs are patent. There is no aneurysm. Posterior circulation: The bilateral V4 segments are patent. The PICA origins are patent bilaterally. The SCAs are patent bilaterally. The basilar artery is patent. The bilateral PCAs are patent. Bilateral posterior communicating arteries are identified. Venous sinuses: Patent. Anatomic variants: None. Review of the MIP images confirms the above findings IMPRESSION: 1. Expected evolution of the early subacute infarct in the left thalamus extending to the midbrain. The additional small infarcts in the right thalamus is not well seen. 2. Patent vasculature of the head and neck, with no stenosis, occlusion, or dissection. The bilateral PICA origins are patent. Electronically Signed   By: Valetta Mole M.D.   On: 11/29/2020 10:51   MR ORBITS W WO CONTRAST  Result Date: 11/28/2020 CLINICAL DATA:  eye palsy and visual deficit; visual deficits. Diplopia for 3 days. EXAM: MRI HEAD AND ORBITS WITHOUT AND WITH CONTRAST TECHNIQUE: Multiplanar, multiecho pulse sequences of the brain and surrounding structures were obtained without and with intravenous contrast. Multiplanar, multiecho pulse sequences of the orbits and surrounding structures were obtained including fat saturation techniques, before and after intravenous contrast administration. CONTRAST:  6.21mL GADAVIST GADOBUTROL 1 MMOL/ML IV SOLN COMPARISON:  None. FINDINGS: MRI HEAD FINDINGS Brain: There is a 2 cm acute to early subacute infarct in the left midbrain and thalamus. Additional punctate acute/subacute infarcts are noted in the right thalamus and left middle frontal gyrus. There is a 3 mm focus of cortical enhancement posteriorly in the left  temporal lobe without corresponding T2 or diffusion-weighted signal abnormality (series 15, image 18 and series 16, image 10). No abnormal intracranial enhancement is seen elsewhere. No intracranial hemorrhage, mass, midline shift, or extra-axial fluid collection is identified. There is borderline mild cerebral atrophy. No significant chronic cerebral white matter disease is evident. 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 Vascular: Major intracranial vascular flow voids are preserved. Skull and upper cervical spine: Unremarkable bone marrow signal. Other: None. MRI ORBITS FINDINGS Orbits: The globes appear intact. No optic nerve edema or abnormal enhancement is identified. No orbital mass or inflammation is evident. The extraocular muscles and lacrimal glands are unremarkable. Visualized sinuses: Minimal bilateral ethmoid air cell mucosal thickening. Clear mastoid air cells. Soft tissues: Unremarkable. IMPRESSION: 1. Acute to early subacute infarcts in the left midbrain, thalami, and left frontal lobe. 2. 3 mm focus of cortical enhancement in the left temporal lobe without edema or diffusion abnormality. This is indeterminate but could reflect a subacute infarct or be vascular. Consider a follow-up MRI in 2-3 months to assess for resolution or persistence/progression. 3. Tiny chronic cerebellar infarcts. 4. Unremarkable appearance of the orbits. Electronically Signed   By: Logan Bores M.D.   On: 11/28/2020 20:40    PHYSICAL EXAM Pleasant middle-aged Hispanic lady not in distress. . Afebrile. Head is nontraumatic. Neck is supple without bruit.    Cardiac exam no murmur or gallop. Lungs are clear to auscultation. Distal pulses are well felt.  Neurological Exam ;  Awake  Alert oriented x 3. Normal speech and language.Marland Kitchen  Restriction of vertical gaze  but preserved horizontal gaze without nystagmus.subjective vertical diplopia.  Fundi were not visualized.  Vision acuity and fields appear normal. Hearing is normal. Palatal movements are normal. Face symmetric. Tongue midline. Normal strength, tone, reflexes and coordination. Normal sensation. Gait deferred.  ASSESSMENT/PLAN Alexa Hatfield is a 50 y.o. 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 Symptoms onsent 11/19 while in the [place] with [place]iness along with double vision.  She was seen there and reportedly had a negative CT scan and a neurology evaluation which was not concerning for stroke but was advised to stop BCPs. She flew back 11/21. Reported to urgent care then ED where MR brain with and without showed acute to early subacute infarct in the left midbrain, bilateral thalami and left frontal lobe.       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   Code Stroke CT head: not obtained.  MR brain and orbits w/wo:  Acute to early subacute infarcts in the left midbrain, thalami, and left frontal lobe. 3 mm focus of cortical enhancement in the left temporal lobe without edema or diffusion abnormality. This is indeterminate but could reflect a subacute infarct or be vascular  Tiny chronic cerebellar infarcts. Unremarkable appearance of the orbits MRA brain Suspected occlusion of the proximal left PICA with normal flow related enhancement of its distal segment. 2D Echo PENDING TCD bubble PENDING Carotid US PENDING Hypercoag panel is PENDING LDL 117 HgbA1c 4.9 VTE prophylaxis - is recommended, per primary team     Diet   Diet Heart Room service appropriate? Yes; Fluid consistency: Thin   No anticoagulant or antiplatelet prior to admission Now on ASA 81mg  and Plavix  75mg  daily pending work up Therapy recommendations:  Outpatient OT/Pending Disposition:  TBD  BP management Stable Permissive hypertension (OK if < 220/120) but gradually normalize in 5-7 days Long-term BP goal normotensive  Hyperlipidemia Home meds:  none LDL 117, goal < 70 High intensity statin Lipitor 40mg  Continue statin at discharge  Other Stroke Risk Factors UDS is pending (discussed with RN x2)  Other [facility] day # 0  Delila A Bailey-Modzik, NP-C I have personally obtained history,examined this patient, reviewed notes, independently viewed imaging studies, participated in medical decision making and plan of care.ROS completed by me personally and pertinent positives fully documented  I have made any additions or clarifications directly to the above note. Agree with note above.  Patient presented with sudden onset of diplopia and dizziness-year-old bilateral medial thalamic left greater than right as well as small left frontal and parietal temporal infarct likely of central cardiogenic etiology.  Recommend continue ongoing stroke work-up by checking echocardiogram, transplant Doppler bubble study for PFO, TEE tomorrow and lab work for hypercoagulability, vasculitis and prolonged cardiac monitoring after discharge for paroxysmal A. fib.  Patient counseled to use an eye patch.  She was advised to discontinue birth control pills assessment: Hypercoagulability Surgeries.  Dual antiplatelet therapy for 3 weeks followed by aspirin alone.  Aggressive risk factor modification.  Greater than 50% time during this 35-minute visit was spent on counseling and coordination of care about her embolic stroke and answering questions.  Antony Contras, MD Medical Director [facility] Pager: [phone] 11/29/2020 3:44  PM   To contact Stroke Continuity provider, please refer to [email or web address] After hours, contact General Neurology

## 2020-11-29 NOTE — Evaluation (Signed)
Physical Therapy Evaluation Patient Details Name: Alexa Hatfield MRN: [id number] DOB: 18-Feb-1970 Today's Date: 11/29/2020  History of Present Illness  50 yo admitted with double vision and fatigue. 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 PMH: complicated Liviu; finger amputation  Clinical Impression  Pt admitted secondary to problem above with deficits below. Unsteady and tended to drift to the R during mobility tasks. Required min guard to min A for mobility this session. Educated about using RW at home to increase safety. Recommending outpatient PT at d/c to address current deficits. Will continue to follow acutely.        Recommendations for follow up therapy are one component of a multi-disciplinary discharge planning process, led by the attending physician.  Recommendations may be updated based on patient status, additional functional criteria and insurance authorization.  Follow Up Recommendations Outpatient PT (neuro outpatient PT)    Assistance Recommended at Discharge Frequent or constant Supervision/Assistance  Functional Status Assessment Patient has had a recent decline in their functional status and demonstrates the ability to make significant improvements in function in a reasonable and predictable amount of time.  Equipment Recommendations  Rolling walker (2 wheels)    Recommendations for Other Services       Precautions / Restrictions Precautions Precautions: Fall Restrictions Weight Bearing Restrictions: No      Mobility  Bed Mobility Overal bed mobility: Modified Independent                  Transfers Overall transfer level: Needs assistance   Transfers: Sit to/from Stand Sit to Stand: Min guard           General transfer comment: Min guard for safety    Ambulation/Gait Ambulation/Gait assistance: Min guard;Min assist Gait Distance  (Feet): 100 Feet Assistive device: 1 person hand held assist Gait Pattern/deviations: Step-through pattern;Decreased stride length;Staggering right;Drifts right/left Gait velocity: Decreased     General Gait Details: Pt tended to drift to the R and with mild LOB to the R. Min guard to min A for steadying. Educated about using RW at home to increase safety.  Stairs            Wheelchair Mobility    Modified Rankin (Stroke Patients Only) Modified Rankin (Stroke Patients Only) Pre-Morbid Rankin Score: No symptoms Modified Rankin: Moderately severe disability     Balance Overall balance assessment: Needs assistance Sitting-balance support: No upper extremity supported Sitting balance-Leahy Scale: Good     Standing balance support: No upper extremity supported Standing balance-Leahy Scale: Fair Standing balance comment: fair static standing balance; poor dynamic                             Pertinent Vitals/Pain      Home Living Family/patient expects to be discharged to:: Private residence Living Arrangements: Parent;Children Available Help at Discharge: Available PRN/intermittently Type of Home: House Home Access: Stairs to enter Entrance Stairs-Rails: Right Entrance Stairs-Number of Steps: 5   Home Layout: One level Home Equipment: None      Prior Function Prior Level of Function : Independent/Modified Independent;Working/employed             Mobility Comments: works in Geologist, engineering at [employer]   Dominant Hand: Right    Extremity/Trunk Assessment   Upper Extremity  Assessment Upper Extremity Assessment: Defer to OT evaluation    Lower Extremity Assessment Lower Extremity Assessment: Overall WFL for tasks assessed    Cervical / Trunk Assessment Cervical / Trunk Assessment: Normal  Communication   Communication: Prefers language other than English (Ipad interpreterLawanna Kobus  [phone])  Cognition Arousal/Alertness: Awake/alert Behavior During Therapy: Flat affect Overall Cognitive Status: No family/caregiver present to determine baseline cognitive functioning                                 General Comments: slow processing; very flat affect        General Comments      Exercises     Assessment/Plan    PT Assessment Patient needs continued PT services  PT Problem List Decreased activity tolerance;Decreased balance;Decreased mobility;Decreased knowledge of precautions;Decreased safety awareness;Decreased knowledge of use of DME;Decreased cognition       PT Treatment Interventions DME instruction;Functional mobility training;Therapeutic activities;Therapeutic exercise;Stair training;Gait training;Balance training;Patient/family education    PT Goals (Current goals can be found in the Care Plan section)  Acute Rehab PT Goals Patient Stated Goal: none stated PT Goal Formulation: With patient Time For Goal Achievement: 12/13/20 Potential to Achieve Goals: Good    Frequency Min 4X/week   Barriers to discharge        Co-evaluation               AM-PAC PT "6 Clicks" Mobility  Outcome Measure Help needed turning from your back to your side while in a flat bed without using bedrails?: None Help needed moving from lying on your back to sitting on the side of a flat bed without using bedrails?: None Help needed moving to and from a bed to a chair (including a wheelchair)?: A Little Help needed standing up from a chair using your arms (e.g., wheelchair or bedside chair)?: A Little Help needed to walk in hospital room?: A Little Help needed climbing 3-5 steps with a railing? : A Lot 6 Click Score: 19    End of Session Equipment Utilized During Treatment: Gait belt Activity Tolerance: Patient tolerated treatment well Patient left: in bed;with call bell/phone within reach;with family/visitor present (transport staff present) Nurse  Communication: Mobility status PT Visit Diagnosis: Unsteadiness on feet (R26.81);Difficulty in walking, not elsewhere classified (R26.2)    Time: 4944-9675 PT Time Calculation (min) (ACUTE ONLY): 15 min   Charges:   PT Evaluation $PT Eval Moderate Complexity: 1 Mod          Farley Ly, PT, DPT  Acute Rehabilitation Services  Pager: [phone] Office: [phone]   Lehman Prom 11/29/2020, 3:33 PM

## 2020-11-29 NOTE — Progress Notes (Signed)
Patient transported to CT 

## 2020-11-29 NOTE — Consult Note (Signed)
NEUROLOGY CONSULTATION NOTE   Date of service: November 29, 2020 Patient Name: Alexa Hatfield MRN:  [id number] DOB:  12-30-1970 Reason for consult: "Fatigue and diplopia" Requesting Provider: Vianne Bulls, Hatfield _ _ _   _ __   _ __ _ _  __ __   _ __   __ _  History of Present Illness  Alexa Hatfield is a 50 y.o. female with no significant past medical history except for complicated Liviu who presents with 3 days history of fatigue and feeling really tired along with double vision.  She reports that she was in [place] when the symptoms initially started.  She went to the ED and had a CT head without contrast which was negative for any acute intracranial abnormalities.  Her symptoms were attribute it to her oral contraceptive pill which was stopped.  She had no significant improvement in her symptoms despite stopping her oral contraceptive pill. When she came here, she went to an urgent care and was sent to the ED for further evaluation and work-up.  He had MRI brain with and without contrast which demonstrated acute to early subacute infarct in the left midbrain, bilateral thalami and left frontal lobe.  Neurology was consulted for further evaluation and work-up of the noted stroke.  She denies any history of prior strokes.  She does not smoke.  She does not drink alcohol.  She does not use any recreational substances.  She denies any family history of strokes.  MRIs: 0 TNKase/thrombectomy: Outside the window for any intervention. LK W 11/25/2020.  NIHSS components Score: Comment  1a Level of Conscious 0[x]  1[]  2[]  3[]      1b LOC Questions 0[x]  1[]  2[]       1c LOC Commands 0[x]  1[]  2[]       2 Best Gaze 0[x]  1[]  2[]       3 Visual 0[x]  1[]  2[]  3[]      4 Facial Palsy 0[x]  1[]  2[]  3[]      5a Motor Arm - left 0[x]  1[]  2[]  3[]  4[]  UN[]    5b Motor Arm - Right 0[x]  1[]  2[]  3[]  4[]  UN[]    6a Motor Leg - Left 0[x]  1[]  2[]  3[]  4[]  UN[]    6b Motor Leg - Right 0[x]  1[]  2[]  3[]  4[]   UN[]    7 Limb Ataxia 0[x]  1[]  2[]  3[]  UN[]     8 Sensory 0[x]  1[]  2[]  UN[]      9 Best Language 0[x]  1[]  2[]  3[]      10 Dysarthria 0[x]  1[]  2[]  UN[]      11 Extinct. and Inattention 0[x]  1[]  2[]       TOTAL: 0     ROS   Constitutional Denies weight loss, fever and chills.   HEENT Denies changes in vision and hearing.   Respiratory Denies SOB and cough.   CV Denies palpitations and CP   GI Denies abdominal pain, nausea, vomiting and diarrhea.   GU Denies dysuria and urinary frequency.   MSK Denies myalgia and joint pain.   Skin Denies rash and pruritus.   Neurological Denies headache and syncope.   Psychiatric Denies recent changes in mood. Denies anxiety and depression.    Past History   Past Medical History:  Diagnosis Date  . Complication of anesthesia    "difficulty breathing and required O2 during c-section"  . Medical history non-contributory    Past Surgical History:  Procedure Laterality Date  . AMPUTATION Left 02/25/2018   Procedure: AMPUTATION OF FINGER;  Surgeon: Dayna Barker, Hatfield;  Location: [facility];  Service: Plastics;  Laterality: Left;  . CESAREAN SECTION     x2   Family History  Problem Relation Age of Onset  . Heart attack Father    Social History   Socioeconomic History  . Marital status: Single    Spouse name: Not on file  . Number of children: Not on file  . Years of education: Not on file  . Highest education level: Not on file  Occupational History  . Not on file  Tobacco Use  . Smoking status: Never  . Smokeless tobacco: Never  Vaping Use  . Vaping Use: Never used  Substance and Sexual Activity  . Alcohol use: Not Currently  . Drug use: Never  . Sexual activity: Not on file  Other Topics Concern  . Not on file  Social History Narrative  . Not on file   Social Determinants of Health   Financial Resource Strain: Not on file  Food Insecurity: Not on file  Transportation Needs: Not on file  Physical Activity: Not on file  Stress: Not  on file  Social Connections: Not on file   No Known Allergies  Medications  (Not in a hospital admission)    Vitals   Vitals:   11/28/20 2030 11/28/20 2320 11/29/20 0115 11/29/20 0330  BP: 129/89 130/74 125/70 135/67  Pulse: 69 72 70 72  Resp: 16 16 16 20   Temp: 98 F (36.7 C) 98.2 F (36.8 C)  98.1 F (36.7 C)  TempSrc: Oral Oral  Oral  SpO2: 100% 100% 99% 100%  Weight:      Height:         Body mass index is 26.17 kg/m.  Physical Exam   General: Laying comfortably in bed; in no acute distress.  HENT: Normal oropharynx and mucosa. Normal external appearance of ears and nose.  Neck: Supple, no pain or tenderness  CV: No JVD. No peripheral edema.  Pulmonary: Symmetric Chest rise. Normal respiratory effort.  Abdomen: Soft to touch, non-tender.  Ext: No cyanosis, edema, or deformity  Skin: No rash. Normal palpation of skin.   Musculoskeletal: Normal digits and nails by inspection. No clubbing.   Neurologic Examination  Mental status/Cognition: Alert, oriented to self, place, month and year, good attention.  Speech/language: Fluent, comprehension intact, object naming intact, repetition intact.  Cranial nerves:   CN II Pupils equal and reactive to light, no VF deficits    CN III,IV,VI EOM intact, disconjugate gaze in primary gaze worse with leftward horizontal gaze.  No gaze preference or deviation, no nystagmus.  He appears to have mild vertical gaze palsy with both upgaze as well as downgaze.   CN V normal sensation in V1, V2, and V3 segments bilaterally    CN VII no asymmetry, no nasolabial fold flattening    CN VIII normal hearing to speech    CN IX & X normal palatal elevation, no uvular deviation    CN XI 5/5 head turn and 5/5 shoulder shrug bilaterally    CN XII midline tongue protrusion    Motor:  Muscle bulk: normal, tone normal, pronator drift none tremor none Mvmt Root Nerve  Muscle Right Left Comments  SA C5/6 Ax Deltoid 5 5   EF C5/6 Mc Biceps 5 5    EE C6/7/8 Rad Triceps 5 5   WF C6/7 Med FCR     WE C7/8 PIN ECU     F Ab C8/T1 U ADM/FDI 5 5   HF L1/2/3 Fem  Illopsoas 5 5   KE L2/3/4 Fem Quad 5 5   DF L4/5 D Peron Tib Ant 5 5   PF S1/2 Tibial Grc/Sol 5 5    Reflexes:  Right Left Comments  Pectoralis      Biceps (C5/6) 2 2   Brachioradialis (C5/6) 2 2    Triceps (C6/7) 2 2    Patellar (L3/4) 2 2    Achilles (S1)      Hoffman      Plantar     Jaw jerk    Sensation:  Light touch Intact throughout   Pin prick    Temperature    Vibration   Proprioception    Coordination/Complex Motor:  - Finger to Nose intact bilaterally - Heel to shin intact bilaterally - Rapid alternating movement are normal bilaterally - Gait: Deferred due to diplopia and feeling slightly off balance. Labs   CBC:  Recent Labs  Lab 11/28/20 1406 11/29/20 0346  WBC 9.5 10.6*  NEUTROABS 7.4  --   HGB 13.6 13.7  HCT 40.7 43.1  MCV 94.0 97.5  PLT 234 236    Basic Metabolic Panel:  Lab Results  Component Value Date   NA 137 11/29/2020   K 3.8 11/29/2020   CO2 18 (L) 11/29/2020   GLUCOSE 91 11/29/2020   BUN 13 11/29/2020   CREATININE 0.97 11/29/2020   CALCIUM 9.4 11/29/2020   GFRNONAA >60 11/29/2020   Lipid Panel:  Lab Results  Component Value Date   LDLCALC 117 (H) 11/29/2020   HgbA1c:  Lab Results  Component Value Date   HGBA1C 4.9 11/29/2020   Urine Drug Screen: No results found for: LABOPIA, COCAINSCRNUR, LABBENZ, AMPHETMU, THCU, LABBARB  Alcohol Level No results found for: [facility]   MR Angio head without contrast and Carotid Duplex BL(Personally reviewed): Pending  MRI Brain(Personally reviewed): Notable for acute to early subacute infarct in the left midbrain, bilateral thalamus and left frontal lobe.  Impression   Alexa Hatfield is a 50 y.o. female with no significant past medical history except for complicated Liviu who presents with 3 days history of fatigue and feeling really tired along with double vision.  I  suspect that the involvement of the midbrain might explain her double vision and possible involvement of the reticular formation in the medial thalami could explain her fatigue/somnolence.  Her stroke appears to be embolic in origin and in addition to standard stroke work-up, she will need evaluation for potential hypercoagulable state.  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   Recommendations  Plan:  Recommend that primary team order following: - Frequent Neuro checks per stroke unit protocol. - Recommend Vascular imaging with MRA Angio Head without contrast and US Carotid doppler - Recommend obtaining TTE with bubble study - Recommend obtaining Lipid panel with LDL - Please start statin if LDL > 70 - Recommend HbA1c - Antithrombotic -aspirin 81 mg daily. - Recommend DVT ppx - SBP goal - permissive hypertension first 24 h < 220/110. Held home meds.  - Recommend Telemetry monitoring for arrythmia - Recommend bedside swallow screen prior to PO intake. - Stroke education booklet - Recommend PT/OT/SLP consult - hypercoagulable workup.  ______________________________________________________________________  Plan discussed with Dr. Antionette Char over secure chat.  Thank you for the opportunity to take part in the care of this patient. If you have any further questions, please contact the neurology consultation attending.  Signed,  Erick Blinks [facility] Pager Number [phone] _ _ _  _ __   _ __ _ _  __ __   _ __   __ _  

## 2020-11-29 NOTE — ED Notes (Signed)
Pt resting on stretcher with eyes closed, respirations even and unlabored. Pt given warm blanket. No acute changes noted. IVF infusing without difficulty. Will continue to monitor.

## 2020-11-29 NOTE — Progress Notes (Addendum)
[facility] PROGRESS NOTE    Progress Note  Alexa Hatfield  SJG:[id number] DOB: [birth year] DOA: 11/28/2020 PCP: Oneita Hurt, No     Brief Narrative:   Alexa Hatfield is an 50 y.o. female no significant past medical history comes into the hospital for evaluation of diplopia and fatigue that started 3 days prior to admission CT scan of the head in the [place] showed no acute findings according to the patient.  And symptoms were attributed to birth control pills.  In the ED she was found to have a bicarbonate of 17 MRI of the brain was concerning for acute early subacute infarct of the left midbrain thalamus and left frontal lobe as well as 3 mm focus cortical enhancement of the left temporal lobe without edema, MRI of the orbit appeared unremarkable    Assessment/Plan:   Acute multiple Ischemic stroke [facility]): Concern about embolic in nature. She started taking oral contraceptive pills that she bought in the [place] HgbA1c 4.9, fasting lipid panel HDL less than 40 LDL greater than 100. MRA of the brain without contrast pending PT, OT, Speech consult pending Carotid dopplers pending Transthoracic Echo with bubble study, pending Start patient on ASA 81mg  daily and plavix 75mg  daily  Started on statins. BP goal: permissive HTN upto 220/120 mmHg No events on telemetry.  Reviewed EKG showed normal sinus rhythm normal axis no T wave abnormalities. TSH of 3.2. Agree with hypercoagulable panel, ANA, ANCA and antidouble-stranded DNA and a fertile female  Mild metabolic acidosis normal anion gap: Repeat a basic metabolic panel after IV fluid hydration.   DVT prophylaxis: lovenox Family Communication:daughter Status is: Observation  The patient will require care spanning > 2 midnights and should be moved to inpatient because: Acute CVA       Code Status:     Code Status Orders  (From admission, onward)           Start     Ordered   11/28/20 2156  Full code   Continuous        11/28/20 2156           Code Status History     This patient has a current code status but no historical code status.         IV Access:   Peripheral IV   Procedures and diagnostic studies:   MR Brain W and Wo Contrast  Result Date: 11/28/2020 CLINICAL DATA:  eye palsy and visual deficit; visual deficits. Diplopia for 3 days. EXAM: MRI HEAD AND ORBITS WITHOUT AND WITH CONTRAST TECHNIQUE: Multiplanar, multiecho pulse sequences of the brain and surrounding structures were obtained without and with intravenous contrast. Multiplanar, multiecho pulse sequences of the orbits and surrounding structures were obtained including fat saturation techniques, before and after intravenous contrast administration. CONTRAST:  6.22mL GADAVIST GADOBUTROL 1 MMOL/ML IV SOLN COMPARISON:  None. FINDINGS: MRI HEAD FINDINGS Brain: There is a 2 cm acute to early subacute infarct in the left midbrain and thalamus. Additional punctate acute/subacute infarcts are noted in the right thalamus and left middle frontal gyrus. There is a 3 mm focus of cortical enhancement posteriorly in the left temporal lobe without corresponding T2 or diffusion-weighted signal abnormality (series 15, image 18 and series 16, image 10). No abnormal intracranial enhancement is seen elsewhere. No intracranial hemorrhage, mass, midline shift, or extra-axial fluid collection is identified. There is borderline mild cerebral atrophy. No significant chronic cerebral white matter disease is evident. There are tiny chronic bilateral cerebellar infarcts. A  mega cisterna magna is incidentally noted. Vascular: Major intracranial vascular flow voids are preserved. Skull and upper cervical spine: Unremarkable bone marrow signal. Other: None. MRI ORBITS FINDINGS Orbits: The globes appear intact. No optic nerve edema or abnormal enhancement is identified. No orbital mass or inflammation is evident. The extraocular muscles and lacrimal  glands are unremarkable. Visualized sinuses: Minimal bilateral ethmoid air cell mucosal thickening. Clear mastoid air cells. Soft tissues: Unremarkable. IMPRESSION: 1. Acute to early subacute infarcts in the left midbrain, thalami, and left frontal lobe. 2. 3 mm focus of cortical enhancement in the left temporal lobe without edema or diffusion abnormality. This is indeterminate but could reflect a subacute infarct or be vascular. Consider a follow-up MRI in 2-3 months to assess for resolution or persistence/progression. 3. Tiny chronic cerebellar infarcts. 4. Unremarkable appearance of the orbits. Electronically Signed   By: Sebastian Ache M.D.   On: 11/28/2020 20:40   MR ORBITS W WO CONTRAST  Result Date: 11/28/2020 CLINICAL DATA:  eye palsy and visual deficit; visual deficits. Diplopia for 3 days. EXAM: MRI HEAD AND ORBITS WITHOUT AND WITH CONTRAST TECHNIQUE: Multiplanar, multiecho pulse sequences of the brain and surrounding structures were obtained without and with intravenous contrast. Multiplanar, multiecho pulse sequences of the orbits and surrounding structures were obtained including fat saturation techniques, before and after intravenous contrast administration. CONTRAST:  6.54mL GADAVIST GADOBUTROL 1 MMOL/ML IV SOLN COMPARISON:  None. FINDINGS: MRI HEAD FINDINGS Brain: There is a 2 cm acute to early subacute infarct in the left midbrain and thalamus. Additional punctate acute/subacute infarcts are noted in the right thalamus and left middle frontal gyrus. There is a 3 mm focus of cortical enhancement posteriorly in the left temporal lobe without corresponding T2 or diffusion-weighted signal abnormality (series 15, image 18 and series 16, image 10). No abnormal intracranial enhancement is seen elsewhere. No intracranial hemorrhage, mass, midline shift, or extra-axial fluid collection is identified. There is borderline mild cerebral atrophy. No significant chronic cerebral white matter disease is evident.  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 Vascular: Major intracranial vascular flow voids are preserved. Skull and upper cervical spine: Unremarkable bone marrow signal. Other: None. MRI ORBITS FINDINGS Orbits: The globes appear intact. No optic nerve edema or abnormal enhancement is identified. No orbital mass or inflammation is evident. The extraocular muscles and lacrimal glands are unremarkable. Visualized sinuses: Minimal bilateral ethmoid air cell mucosal thickening. Clear mastoid air cells. Soft tissues: Unremarkable. IMPRESSION: 1. Acute to early subacute infarcts in the left midbrain, thalami, and left frontal lobe. 2. 3 mm focus of cortical enhancement in the left temporal lobe without edema or diffusion abnormality. This is indeterminate but could reflect a subacute infarct or be vascular. Consider a follow-up MRI in 2-3 months to assess for resolution or persistence/progression. 3. Tiny chronic cerebellar infarcts. 4. Unremarkable appearance of the orbits. Electronically Signed   By: Sebastian Ache M.D.   On: 11/28/2020 20:40     Medical Consultants:   None.   Subjective:    Annalisia Ingber relates she still has diplopia.  Objective:    Vitals:   11/28/20 2320 11/29/20 0115 11/29/20 0330 11/29/20 0632  BP: 130/74 125/70 135/67 118/87  Pulse: 72 70 72 62  Resp: 16 16 20 16   Temp: 98.2 F (36.8 C)  98.1 F (36.7 C)   TempSrc: Oral  Oral   SpO2: 100% 99% 100% 100%  Weight:      Height:  SpO2: 100 %  No intake or output data in the 24 hours ending 11/29/20 6803 Filed Weights   11/28/20 1127  Weight: 67 kg    Exam: General exam: In no acute distress. Respiratory system: Good air movement and clear to auscultation. Cardiovascular system: S1 & S2 heard, RRR. No JVD.  Gastrointestinal system: Abdomen is nondistended, soft and nontender.  Extremities: No pedal edema. Skin: No rashes, lesions or  ulcers Psychiatry: Judgement and insight appear normal. Mood & affect appropriate.    Data Reviewed:    Labs: Basic Metabolic Panel: Recent Labs  Lab 11/28/20 1406 11/29/20 0346  NA 137 137  K 3.8 3.8  CL 112* 111  CO2 17* 18*  GLUCOSE 91 91  BUN 9 13  CREATININE 0.95 0.97  CALCIUM 8.9 9.4   GFR Estimated Creatinine Clearance: 64.5 mL/min (by C-G formula based on SCr of 0.97 mg/dL). Liver Function Tests: Recent Labs  Lab 11/29/20 0346  AST 21  ALT 27  ALKPHOS 74  BILITOT 1.7*  PROT 7.2  ALBUMIN 3.5   No results for input(s): LIPASE, AMYLASE in the last 168 hours. No results for input(s): AMMONIA in the last 168 hours. Coagulation profile No results for input(s): INR, PROTIME in the last 168 hours. COVID-19 Labs  No results for input(s): DDIMER, FERRITIN, LDH, CRP in the last 72 hours.  Lab Results  Component Value Date   SARSCOV2NAA NEGATIVE 11/28/2020    CBC: Recent Labs  Lab 11/28/20 1406 11/29/20 0346  WBC 9.5 10.6*  NEUTROABS 7.4  --   HGB 13.6 13.7  HCT 40.7 43.1  MCV 94.0 97.5  PLT 234 236   Cardiac Enzymes: No results for input(s): CKTOTAL, CKMB, CKMBINDEX, TROPONINI in the last 168 hours. BNP (last 3 results) No results for input(s): PROBNP in the last 8760 hours. CBG: No results for input(s): GLUCAP in the last 168 hours. D-Dimer: No results for input(s): DDIMER in the last 72 hours. Hgb A1c: Recent Labs    11/29/20 0346  HGBA1C 4.9   Lipid Profile: Recent Labs    11/29/20 0346  CHOL 177  HDL 35*  LDLCALC 117*  TRIG 123  CHOLHDL 5.1   Thyroid function studies: Recent Labs    11/29/20 0346  TSH 3.244   Anemia work up: No results for input(s): VITAMINB12, FOLATE, FERRITIN, TIBC, IRON, RETICCTPCT in the last 72 hours. Sepsis Labs: Recent Labs  Lab 11/28/20 1406 11/29/20 0346  WBC 9.5 10.6*   Microbiology Recent Results (from the past 240 hour(s))  Resp Panel by RT-PCR (Flu A&B, Covid) Nasopharyngeal Swab      Status: None   Collection Time: 11/28/20  9:23 PM   Specimen: Nasopharyngeal Swab; Nasopharyngeal(NP) swabs in vial transport medium  Result Value Ref Range Status   SARS Coronavirus 2 by RT PCR NEGATIVE NEGATIVE Final    Comment: (NOTE) SARS-CoV-2 target nucleic acids are NOT DETECTED.  The SARS-CoV-2 RNA is generally detectable in upper respiratory specimens during the acute phase of infection. The lowest concentration of SARS-CoV-2 viral copies this assay can detect is 138 copies/mL. A negative result does not preclude SARS-Cov-2 infection and should not be used as the sole basis for treatment or other patient management decisions. A negative result may occur with  improper specimen collection/handling, submission of specimen other than nasopharyngeal swab, presence of viral mutation(s) within the areas targeted by this assay, and inadequate number of viral copies(<138 copies/mL). A negative result must be combined with clinical observations, patient history, and  epidemiological information. The expected result is Negative.  Fact Sheet for Patients:  BloggerCourse.com  Fact Sheet for Healthcare Providers:  SeriousBroker.it  This test is no t yet approved or cleared by the [place] FDA and  has been authorized for detection and/or diagnosis of SARS-CoV-2 by FDA under an Emergency Use Authorization (EUA). This EUA will remain  in effect (meaning this test can be used) for the duration of the COVID-19 declaration under Section 564(b)(1) of the Act, 21 U.S.C.section 360bbb-3(b)(1), unless the authorization is terminated  or revoked sooner.       Influenza A by PCR NEGATIVE NEGATIVE Final   Influenza B by PCR NEGATIVE NEGATIVE Final    Comment: (NOTE) The Xpert Xpress SARS-CoV-2/FLU/RSV plus assay is intended as an aid in the diagnosis of influenza from Nasopharyngeal swab specimens and should not be used as a sole basis  for treatment. Nasal washings and aspirates are unacceptable for Xpert Xpress SARS-CoV-2/FLU/RSV testing.  Fact Sheet for Patients: BloggerCourse.com  Fact Sheet for Healthcare Providers: SeriousBroker.it  This test is not yet approved or cleared by the [place] FDA and has been authorized for detection and/or diagnosis of SARS-CoV-2 by FDA under an Emergency Use Authorization (EUA). This EUA will remain in effect (meaning this test can be used) for the duration of the COVID-19 declaration under Section 564(b)(1) of the Act, 21 U.S.C. section 360bbb-3(b)(1), unless the authorization is terminated or revoked.  Performed at [facility], [address] [place], [place] [address]      Medications:     stroke: mapping our early stages of recovery book   Does not apply Once   enoxaparin (LOVENOX) injection  40 mg Subcutaneous Q24H   Continuous Infusions:  sodium chloride 100 mL/hr at 11/28/20 2254      LOS: 0 days   Marinda Elk  [facility]  11/29/2020, 7:12 AM

## 2020-11-29 NOTE — Progress Notes (Signed)
TCD bubble study completed with Dr. Pearlean Brownie. Refer to "CV Proc" under chart review to view preliminary results.  11/29/2020 3:41 PM Eula Fried., MHA, RVT, RDCS, RDMS

## 2020-11-29 NOTE — ED Notes (Signed)
Patient transported to MRI 

## 2020-11-29 NOTE — Progress Notes (Signed)
SLP Cancellation Note  Patient Details Name: Oniya Mandarino MRN: [id number] DOB: 1970/12/12   Cancelled treatment:       Reason Eval/Treat Not Completed: Patient at procedure or test/unavailable Numerous providers in room  Akshara Blumenthal, Riley Nearing 11/29/2020, 11:36 AM

## 2020-11-29 NOTE — Progress Notes (Signed)
  Echocardiogram 2D Echocardiogram has been performed.  Alexa Hatfield 11/29/2020, 5:38 PM

## 2020-11-30 ENCOUNTER — Encounter (HOSPITAL_COMMUNITY)
Admission: EM | Disposition: A | Discharge status: Home / Self Care | Payer: Self-pay | Source: Home / Self Care | Attending: Internal Medicine

## 2020-11-30 ENCOUNTER — Encounter (HOSPITAL_COMMUNITY): Payer: Self-pay | Admitting: Internal Medicine

## 2020-11-30 ENCOUNTER — Inpatient Hospital Stay (HOSPITAL_COMMUNITY): Payer: 59

## 2020-11-30 ENCOUNTER — Inpatient Hospital Stay (HOSPITAL_COMMUNITY): Payer: 59 | Admitting: Anesthesiology

## 2020-11-30 DIAGNOSIS — I639 Cerebral infarction, unspecified: Secondary | ICD-10-CM | POA: Diagnosis not present

## 2020-11-30 DIAGNOSIS — I351 Nonrheumatic aortic (valve) insufficiency: Secondary | ICD-10-CM

## 2020-11-30 HISTORY — PX: TEE WITHOUT CARDIOVERSION: SHX5443

## 2020-11-30 HISTORY — PX: BUBBLE STUDY: SHX6837

## 2020-11-30 LAB — RAPID URINE DRUG SCREEN, HOSP PERFORMED
Amphetamines: NOT DETECTED
Barbiturates: NOT DETECTED
Benzodiazepines: NOT DETECTED
Cocaine: NOT DETECTED
Opiates: NOT DETECTED
Tetrahydrocannabinol: NOT DETECTED

## 2020-11-30 LAB — ANTI-DNA ANTIBODY, DOUBLE-STRANDED: ds DNA Ab: <1 IU/mL (ref 0–9)

## 2020-11-30 SURGERY — ECHOCARDIOGRAM, TRANSESOPHAGEAL
Anesthesia: Monitor Anesthesia Care

## 2020-11-30 MED ORDER — SODIUM CHLORIDE 0.9 % IV SOLN: INTRAVENOUS | Status: DC

## 2020-11-30 MED ORDER — PROPOFOL 500 MG/50ML IV EMUL
INTRAVENOUS | Status: DC | PRN
  Administered 2020-11-30: 100 ug/kg/min via INTRAVENOUS

## 2020-11-30 MED ORDER — PHENYLEPHRINE 40 MCG/ML (10ML) SYRINGE FOR IV PUSH (FOR BLOOD PRESSURE SUPPORT)
PREFILLED_SYRINGE | INTRAVENOUS | Status: DC | PRN
  Administered 2020-11-30: 80 ug via INTRAVENOUS

## 2020-11-30 MED ORDER — PROPOFOL 10 MG/ML IV BOLUS
INTRAVENOUS | Status: DC | PRN
  Administered 2020-11-30 (×2): 20 mg via INTRAVENOUS
  Administered 2020-11-30: 30 mg via INTRAVENOUS

## 2020-11-30 NOTE — Anesthesia Preprocedure Evaluation (Addendum)
Anesthesia Evaluation  Patient identified by MRN, date of birth, ID band Patient awake    Reviewed: Allergy & Precautions, NPO status , Patient's Chart, lab work & pertinent test results  History of Anesthesia Complications Negative for: history of anesthetic complications  Airway Mallampati: II  TM Distance: >3 FB Neck ROM: Full    Dental  (+) Dental Advisory Given, Partial Upper   Pulmonary neg pulmonary ROS,    Pulmonary exam normal breath sounds clear to auscultation       Cardiovascular negative cardio ROS Normal cardiovascular exam Rhythm:Regular Rate:Normal  IMPRESSIONS   1. Left ventricular ejection fraction, by estimation, is 55 to 60%. The left ventricle has normal function. The left ventricle has no regional wall motion abnormalities. Left ventricular diastolic parameters were normal. 2. Right ventricular systolic function is normal. The right ventricular size is normal. There is normal pulmonary artery systolic pressure. The estimated right ventricular systolic pressure is 89.1 mmHg. 3. The mitral valve is normal in structure. No evidence of mitral valve regurgitation. No evidence of mitral stenosis. 4. The aortic valve is tricuspid. Aortic valve regurgitation is mild. No aortic stenosis is present. 5. The inferior vena cava is normal in size with greater than 50% respiratory variability, suggesting right atrial pressure of 3 mmHg.    Neuro/Psych CVA negative psych ROS   GI/Hepatic negative GI ROS, Neg liver ROS,   Endo/Other  negative endocrine ROS  Renal/GU negative Renal ROS  negative genitourinary   Musculoskeletal negative musculoskeletal ROS (+)   Abdominal   Peds negative pediatric ROS (+)  Hematology negative hematology ROS (+)   Anesthesia Other Findings   Reproductive/Obstetrics negative OB ROS                           Anesthesia Physical  Anesthesia  Plan  ASA: 3  Anesthesia Plan: MAC   Post-op Pain Management: Minimal or no pain anticipated   Induction: Intravenous  PONV Risk Score and Plan: Ondansetron, Dexamethasone and Propofol infusion  Airway Management Planned: Natural Airway  Additional Equipment:   Intra-op Plan:   Post-operative Plan:   Informed Consent: I have reviewed the patients History and Physical, chart, labs and discussed the procedure including the risks, benefits and alternatives for the proposed anesthesia with the patient or authorized representative who has indicated his/her understanding and acceptance.     Dental advisory given and Interpreter used for interveiw  Plan Discussed with: Anesthesiologist and CRNA  Anesthesia Plan Comments:        Anesthesia Quick Evaluation

## 2020-11-30 NOTE — Progress Notes (Signed)
PT Cancellation Note  Patient Details Name: Shaqueta Casady MRN: [id number] DOB: Nov 16, 1970   Cancelled Treatment:    Reason Eval/Treat Not Completed: Patient at procedure or test/unavailable. Pt currently off unit for procedure. Will check back as schedule allows to continue with PT POC.    Marylynn Pearson 11/30/2020, 1:36 PM  Conni Slipper, PT, DPT Acute Rehabilitation Services Pager: [phone] Office: [phone]

## 2020-11-30 NOTE — Progress Notes (Signed)
STROKE TEAM PROGRESS NOTE   INTERVAL HISTORY No acute events. Hemodynamically stable. Afebrile. Patient is reporting continued double vision. She is awaiting TEE today.  TEE shows aneurysmal interatrial septum with possibly a small PFO.  Patient continues to have mild diplopia and restricted vertical gaze.  No new complaints.  Vital signs stable.  Vitals:   11/29/20 2345 11/30/20 0425 11/30/20 0848 11/30/20 1219  BP: 103/80 112/73 105/78 127/84  Pulse: 70 73 79 77  Resp: 18 18 18 16   Temp: 98.4 F (36.9 C) (!) 97.5 F (36.4 C) 98.2 F (36.8 C)   TempSrc: Oral Oral Oral   SpO2: 99% 100% 99% 98%  Weight:    67 kg  Height:    5\' 3"  (1.6 m)   CBC:  Recent Labs  Lab 11/28/20 1406 11/29/20 0346  WBC 9.5 10.6*  NEUTROABS 7.4  --   HGB 13.6 13.7  HCT 40.7 43.1  MCV 94.0 97.5  PLT 234 236   Basic Metabolic Panel:  Recent Labs  Lab 11/28/20 1406 11/29/20 0346  NA 137 137  K 3.8 3.8  CL 112* 111  CO2 17* 18*  GLUCOSE 91 91  BUN 9 13  CREATININE 0.95 0.97  CALCIUM 8.9 9.4   Lipid Panel:  Recent Labs  Lab 11/29/20 0346  CHOL 177  TRIG 123  HDL 35*  CHOLHDL 5.1  VLDL 25  LDLCALC 184*   HgbA1c:  Recent Labs  Lab 11/29/20 0346  HGBA1C 4.9   Urine Drug Screen:  Recent Labs  Lab 11/30/20 0433  LABOPIA NONE DETECTED  COCAINSCRNUR NONE DETECTED  LABBENZ NONE DETECTED  AMPHETMU NONE DETECTED  THCU NONE DETECTED  LABBARB NONE DETECTED    Alcohol Level No results for input(s): ETH in the last 168 hours.  IMAGING past 24 hours VAS [place] TRANSCRANIAL DOPPLER W BUBBLES  Result Date: 11/29/2020  Transcranial Doppler with Bubble Patient Name:  Alexa Hatfield  Date of Exam:   11/29/2020 Medical Rec #: [id number]           Accession #:    [id number] Date of Birth: 22-Apr-1970          Patient Gender: F Patient Age:   70 years Exam Location:  [facility] Procedure:      VAS [place] TRANSCRANIAL DOPPLER W BUBBLES Referring Phys: Quinette Hentges  --------------------------------------------------------------------------------  Indications: Stroke. Comparison Study: No prior study Performing Technologist: Gertie Fey MHA, RDMS, RVT, RDCS  Examination Guidelines: A complete evaluation includes B-mode imaging, spectral Doppler, color Doppler, and power Doppler as needed of all accessible portions of each vessel. Bilateral testing is considered an integral part of a complete examination. Limited examinations for reoccurring indications may be performed as noted.  Summary: No HITS at rest or during Valsalva. Negative transcranial Doppler Bubble study with no evidence of right to left intracardiac communication.  A vascular evaluation was performed. The right middle cerebral artery was studied. An IV was inserted into the patient's right forearm. Verbal informed consent was obtained.  *See table(s) above for TCD measurements and observations.    Preliminary    ECHOCARDIOGRAM COMPLETE  Result Date: 11/29/2020    ECHOCARDIOGRAM REPORT   Patient Name:   Alexa Hatfield Date of Exam: 11/29/2020 Medical Rec #:  [id number]          Height:       63.0 in Accession #:    [id number]         Weight:  147.7 lb Date of Birth:  [birth year]         BSA:          1.700 m Patient Age:    49 years           BP:           111/79 mmHg Patient Gender: F                  HR:           67 bpm. Exam Location:  Inpatient Procedure: 2D Echo, Cardiac Doppler and Color Doppler Indications:    Stroke  History:        Patient has no prior history of Echocardiogram examinations.  Sonographer:    Roosvelt Maser RDCS Referring Phys: [phone] ABRAHAM FELIZ ORTIZ IMPRESSIONS  1. Left ventricular ejection fraction, by estimation, is 55 to 60%. The left ventricle has normal function. The left ventricle has no regional wall motion abnormalities. Left ventricular diastolic parameters were normal.  2. Right ventricular systolic function is normal. The right ventricular size is normal. There  is normal pulmonary artery systolic pressure. The estimated right ventricular systolic pressure is 24.7 mmHg.  3. The mitral valve is normal in structure. No evidence of mitral valve regurgitation. No evidence of mitral stenosis.  4. The aortic valve is tricuspid. Aortic valve regurgitation is mild. No aortic stenosis is present.  5. The inferior vena cava is normal in size with greater than 50% respiratory variability, suggesting right atrial pressure of 3 mmHg. FINDINGS  Left Ventricle: Left ventricular ejection fraction, by estimation, is 55 to 60%. The left ventricle has normal function. The left ventricle has no regional wall motion abnormalities. The left ventricular internal cavity size was normal in size. There is  no left ventricular hypertrophy. Left ventricular diastolic parameters were normal. Right Ventricle: The right ventricular size is normal. No increase in right ventricular wall thickness. Right ventricular systolic function is normal. There is normal pulmonary artery systolic pressure. The tricuspid regurgitant velocity is 2.33 m/s, and  with an assumed right atrial pressure of 3 mmHg, the estimated right ventricular systolic pressure is 24.7 mmHg. Left Atrium: Left atrial size was normal in size. Right Atrium: Right atrial size was normal in size. Pericardium: There is no evidence of pericardial effusion. Mitral Valve: The mitral valve is normal in structure. No evidence of mitral valve regurgitation. No evidence of mitral valve stenosis. Tricuspid Valve: The tricuspid valve is normal in structure. Tricuspid valve regurgitation is trivial. Aortic Valve: The aortic valve is tricuspid. Aortic valve regurgitation is mild. Aortic regurgitation PHT measures 600 msec. No aortic stenosis is present. Pulmonic Valve: The pulmonic valve was normal in structure. Pulmonic valve regurgitation is not visualized. Aorta: The aortic root is normal in size and structure. Venous: The inferior vena cava is normal in  size with greater than 50% respiratory variability, suggesting right atrial pressure of 3 mmHg. IAS/Shunts: No atrial level shunt detected by color flow Doppler.  LEFT VENTRICLE PLAX 2D LVIDd:         3.40 cm     Diastology LVIDs:         2.20 cm     LV e' medial:    8.38 cm/s LV PW:         0.90 cm     LV E/e' medial:  9.6 LV IVS:        1.00 cm     LV e' lateral:   12.70 cm/s  LV E/e' lateral: 6.3  LV Volumes (MOD) LV vol d, MOD A2C: 64.9 ml LV vol d, MOD A4C: 61.3 ml LV vol s, MOD A2C: 17.2 ml LV vol s, MOD A4C: 18.2 ml LV SV MOD A2C:     47.7 ml LV SV MOD A4C:     61.3 ml LV SV MOD BP:      46.4 ml RIGHT VENTRICLE RV Basal diam:  2.70 cm RV S prime:     11.60 cm/s TAPSE (M-mode): 2.2 cm LEFT ATRIUM             Index        RIGHT ATRIUM           Index LA diam:        3.60 cm 2.12 cm/m   RA Area:     10.50 cm LA Vol (A2C):   36.1 ml 21.24 ml/m  RA Volume:   21.80 ml  12.82 ml/m LA Vol (A4C):   25.8 ml 15.18 ml/m LA Biplane Vol: 32.7 ml 19.24 ml/m  AORTIC VALVE LVOT Vmax:   96.00 cm/s LVOT Vmean:  66.900 cm/s LVOT VTI:    0.172 m AI PHT:      600 msec  AORTA Ao Root diam: 3.00 cm MITRAL VALVE               TRICUSPID VALVE MV Area (PHT): 2.70 cm    TR Peak grad:   21.7 mmHg MV Decel Time: 281 msec    TR Vmax:        233.00 cm/s MV E velocity: 80.60 cm/s MV A velocity: 76.30 cm/s  SHUNTS MV E/A ratio:  1.06        Systemic VTI: 0.17 m Dalton McleanMD Electronically signed by Wilfred Lacy Signature Date/Time: 11/29/2020/5:59:32 PM    Final     PHYSICAL EXAM Pleasant middle-aged Hispanic lady not in distress. . Afebrile. Head is nontraumatic. Neck is supple without bruit.    Cardiac exam no murmur or gallop. Lungs are clear to auscultation. Distal pulses are well felt.  Neurological Exam ;  Awake  Alert oriented x 3. Normal speech and language.Marland Kitchen  Restriction of vertical gaze but preserved horizontal gaze without nystagmus.subjective vertical diplopia.  Fundi were not  visualized. Vision acuity and fields appear normal. Hearing is normal. Palatal movements are normal. Face symmetric. Tongue midline. Normal strength, tone, reflexes and coordination. Normal sensation. Gait deferred.  ASSESSMENT/PLAN Ms. Alexa Hatfield is a 50 y.o. 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 Symptoms onsent 11/19 while in the [place] with dizziness along with double vision.  She was seen there and reportedly had a negative CT scan and a neurology evaluation which was not concerning for stroke but was advised to stop BCPs. She flew back 11/21. Reported to urgent care then ED where MR brain with and without showed acute to early subacute infarct in the left midbrain, bilateral thalami and left frontal lobe.    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  Code Stroke CT head: not obtained.  MR brain and orbits w/wo:  Acute to early subacute infarcts in the left midbrain, thalami, and left frontal lobe.3 mm focus of cortical enhancement in the left  temporal lobe without edema or diffusion abnormality. This is indeterminate but could reflect a subacute infarct or be vascular. Tiny chronic cerebellar infarcts. Unremarkable appearance of the orbits.  MRA brain Suspected occlusion of the proximal left PICA with normal flow related enhancement of its distal segment. 2D Echo  EF 55-60%, No thrombus, wall motion abnormality or shunt found.   TCD bubble negative for right-to-left shunt Hypercoag panel is PENDING TEE aneurysmal interatrial septum with small PFO unlikely to be clinically significant LDL 117 HgbA1c 4.9 VTE prophylaxis - is recommended, per primary team      Diet   Diet NPO time specified   No anticoagulant or antiplatelet prior to admission Now on ASA 81mg  and Plavix 75mg  daily pending work up Therapy recommendations:  Outpatient OT/Pending Disposition:  TBD  BP management Stable Permissive hypertension (OK if < 220/120) but gradually normalize in 5-7 days Long-term BP goal normotensive  Hyperlipidemia Home meds:  none LDL 117, goal < 70 High intensity statin Lipitor 40mg  Continue statin at discharge  Other Stroke Risk Factors None, UDS clear   Other Active Problems  Hospital day # 1  Alexa A Bailey-Modzik, NP-C This patient was seen and evaluated with Dr. . He directed the plan of care.   I have personally obtained history,examined this patient, reviewed notes, independently viewed imaging studies, participated in medical decision making and plan of care.ROS completed by me personally and pertinent positives fully documented  I have made any additions or clarifications directly to the above note. Agree with note above.  Mobilize out of bed.  Therapy consults.  Aspirin and Plavix for 3 weeks followed by aspirin alone and aggressive risk factor modification.  Follow hypercoagulable and vasculitic labs.  Follow-up in outpatient stroke clinic in 2 months.  Greater than 50% time during this 25-minute visit was spent on counseling and coordination of care about her stroke and discussion about stroke prevention and answering questions.  Discussed with Dr. stroke team will sign off.  Kindly call for questions  , MD Medical Director [facility] Pager: [phone] 11/30/2020 3:11 PM  To contact Stroke Continuity provider, please refer to [facility]. After hours, contact General Neurology

## 2020-11-30 NOTE — Progress Notes (Signed)
SLP Cancellation Note  Patient Details Name: Alexa Hatfield MRN: [id number] DOB: [birth year]   Cancelled treatment:       Reason Eval/Treat Not Completed: Patient at procedure or test/unavailable. Pt has demonstrated cognitive impairment with OT including inattention, decreased awareness and flat affect. Though acute assessment has not been completed, recommend f/u with SLP at CIR.    Dorita Rowlands, Riley Nearing 11/30/2020, 1:25 PM

## 2020-11-30 NOTE — TOC Progression Note (Signed)
Transition of Care [facility]) - Progression Note    Patient Details  Name: Alexa Hatfield MRN: [id number] Date of Birth: Jun 23, 1970  [facility] CM/SW Contact  Kermit Balo, RN Phone Number: 11/30/2020, 2:50 PM  Clinical Narrative:    Today therapy has changed recommendations to CIR. CM will follow for d/c disposition.    Expected Discharge Plan: IP Rehab Facility Barriers to Discharge: Continued Medical Work up  Expected Discharge Plan and Services Expected Discharge Plan: IP Rehab Facility   Discharge Planning Services: CM Consult Post Acute Care Choice: Durable Medical Equipment Living arrangements for the past 2 months: Single Family Home                                       Social Determinants of Health (SDOH) Interventions    Readmission Risk Interventions No flowsheet data found.

## 2020-11-30 NOTE — Progress Notes (Shared)
STROKE TEAM PROGRESS NOTE   INTERVAL HISTORY Traveled to [place] 2 weeks ago. On 11/20 she noted onset of diaphoresis, drowsiness, double vision, lightheadedness and dizziness for which she sought medical help in [facility]. Double vision went away when she closed one eye. She had a head CT and saw a neurologist there who told her she had nerve problems in her eyes. She flew back on Sunday 11/21. Then reported to ED here due to persistent double vision. Stopped BCPs on 11/20. Denies smoking. Denies miscarriages or previous clot history. No family history of early stroke.  We discussed her stroke diagnosis, possible etiologies and ongoing work up for source including imaging, procedures (TCD bubble), echo), and hypercoaguable labs. Possible TEE also discussed.  Daughter is at bedside. Questions were answered.   Vitals:   11/29/20 2012 11/29/20 2345 11/30/20 0425 11/30/20 0848  BP: 101/67 103/80 112/73 105/78  Pulse: 69 70 73 79  Resp: Temp: 97.8 F (36.6 C) 98.4 F (36.9 C) (!) 97.5 F (36.4 C) 98.2 F (36.8 C)  TempSrc: Axillary Oral Oral Oral  SpO2: 99% 99% 100% 99%  Weight:      Height:       CBC:  Recent Labs  Lab 11/28/20 1406 11/29/20 0346  WBC 9.5 10.6*  NEUTROABS 7.4  --   HGB 13.6 13.7  HCT 40.7 43.1  MCV 94.0 97.5  PLT 234 236   Basic Metabolic Panel:  Recent Labs  Lab 11/28/20 1406 11/29/20 0346  NA 137 137  K 3.8 3.8  CL 112* 111  CO2 17* 18*  GLUCOSE 91 91  BUN 9 13  CREATININE 0.95 0.97  CALCIUM 8.9 9.4   Lipid Panel:  Recent Labs  Lab 11/29/20 0346  CHOL 177  TRIG 123  HDL 35*  CHOLHDL 5.1  VLDL 25  LDLCALC 454*   HgbA1c:  Recent Labs  Lab 11/29/20 0346  HGBA1C 4.9   Urine Drug Screen:  Recent Labs  Lab 11/30/20 0433  LABOPIA NONE DETECTED  COCAINSCRNUR NONE DETECTED  LABBENZ NONE DETECTED  AMPHETMU NONE DETECTED  THCU NONE DETECTED  LABBARB NONE DETECTED    Alcohol Level No results for input(s): ETH  in the last 168 hours.  IMAGING past 24 hours VAS [place] TRANSCRANIAL DOPPLER W BUBBLES  Result Date: 11/29/2020  Transcranial Doppler with Bubble Patient Name:  Alexa Hatfield  Date of Exam:   11/29/2020 Medical Rec #: [id number]           Accession #:    [id number] Date of Birth: 03-Apr-1970          Patient Gender: F Patient Age:   50 years Exam Location:  [facility] Procedure:      VAS [place] TRANSCRANIAL DOPPLER W BUBBLES Referring Phys: PRAMOD SETHI --------------------------------------------------------------------------------  Indications: Stroke. Comparison Study: No prior study Performing Technologist: Gertie Fey MHA, RDMS, RVT, RDCS  Examination Guidelines: A complete evaluation includes B-mode imaging, spectral Doppler, color Doppler, and power Doppler as needed of all accessible portions of each vessel. Bilateral testing is considered an integral part of a complete examination. Limited examinations for reoccurring indications may be performed as noted.  Summary: No HITS at rest or during Valsalva. Negative transcranial Doppler Bubble study with no evidence of right to left intracardiac communication.  A vascular evaluation was performed. The right middle cerebral artery was studied. An IV was inserted into the patient's right forearm. Verbal informed consent was obtained.  *See table(s)  above for TCD measurements and observations.    Preliminary    ECHOCARDIOGRAM COMPLETE  Result Date: 11/29/2020    ECHOCARDIOGRAM REPORT   Patient Name:   Alexa Hatfield Date of Exam: 11/29/2020 Medical Rec #:  [id number]          Height:       63.0 in Accession #:    [id number]         Weight:       147.7 lb Date of Birth:  1970-12-13         BSA:          1.700 m Patient Age:    50 years           BP:           111/79 mmHg Patient Gender: F                  HR:           67 bpm. Exam Location:  Inpatient Procedure: 2D Echo, Cardiac Doppler and Color Doppler Indications:    Stroke  History:         Patient has no prior history of Echocardiogram examinations.  Sonographer:    Roosvelt Maser RDCS Referring Phys: [phone] ABRAHAM FELIZ ORTIZ IMPRESSIONS  1. Left ventricular ejection fraction, by estimation, is 55 to 60%. The left ventricle has normal function. The left ventricle has no regional wall motion abnormalities. Left ventricular diastolic parameters were normal.  2. Right ventricular systolic function is normal. The right ventricular size is normal. There is normal pulmonary artery systolic pressure. The estimated right ventricular systolic pressure is 24.7 mmHg.  3. The mitral valve is normal in structure. No evidence of mitral valve regurgitation. No evidence of mitral stenosis.  4. The aortic valve is tricuspid. Aortic valve regurgitation is mild. No aortic stenosis is present.  5. The inferior vena cava is normal in size with greater than 50% respiratory variability, suggesting right atrial pressure of 3 mmHg. FINDINGS  Left Ventricle: Left ventricular ejection fraction, by estimation, is 55 to 60%. The left ventricle has normal function. The left ventricle has no regional wall motion abnormalities. The left ventricular internal cavity size was normal in size. There is  no left ventricular hypertrophy. Left ventricular diastolic parameters were normal. Right Ventricle: The right ventricular size is normal. No increase in right ventricular wall thickness. Right ventricular systolic function is normal. There is normal pulmonary artery systolic pressure. The tricuspid regurgitant velocity is 2.33 m/s, and  with an assumed right atrial pressure of 3 mmHg, the estimated right ventricular systolic pressure is 24.7 mmHg. Left Atrium: Left atrial size was normal in size. Right Atrium: Right atrial size was normal in size. Pericardium: There is no evidence of pericardial effusion. Mitral Valve: The mitral valve is normal in structure. No evidence of mitral valve regurgitation. No evidence of mitral valve  stenosis. Tricuspid Valve: The tricuspid valve is normal in structure. Tricuspid valve regurgitation is trivial. Aortic Valve: The aortic valve is tricuspid. Aortic valve regurgitation is mild. Aortic regurgitation PHT measures 600 msec. No aortic stenosis is present. Pulmonic Valve: The pulmonic valve was normal in structure. Pulmonic valve regurgitation is not visualized. Aorta: The aortic root is normal in size and structure. Venous: The inferior vena cava is normal in size with greater than 50% respiratory variability, suggesting right atrial pressure of 3 mmHg. IAS/Shunts: No atrial level shunt detected by color flow Doppler.  LEFT VENTRICLE PLAX 2D LVIDd:  3.40 cm     Diastology LVIDs:         2.20 cm     LV e' medial:    8.38 cm/s LV PW:         0.90 cm     LV E/e' medial:  9.6 LV IVS:        1.00 cm     LV e' lateral:   12.70 cm/s                            LV E/e' lateral: 6.3  LV Volumes (MOD) LV vol d, MOD A2C: 64.9 ml LV vol d, MOD A4C: 61.3 ml LV vol s, MOD A2C: 17.2 ml LV vol s, MOD A4C: 18.2 ml LV SV MOD A2C:     47.7 ml LV SV MOD A4C:     61.3 ml LV SV MOD BP:      46.4 ml RIGHT VENTRICLE RV Basal diam:  2.70 cm RV S prime:     11.60 cm/s TAPSE (M-mode): 2.2 cm LEFT ATRIUM             Index        RIGHT ATRIUM           Index LA diam:        3.60 cm 2.12 cm/m   RA Area:     10.50 cm LA Vol (A2C):   36.1 ml 21.24 ml/m  RA Volume:   21.80 ml  12.82 ml/m LA Vol (A4C):   25.8 ml 15.18 ml/m LA Biplane Vol: 32.7 ml 19.24 ml/m  AORTIC VALVE LVOT Vmax:   96.00 cm/s LVOT Vmean:  66.900 cm/s LVOT VTI:    0.172 m AI PHT:      600 msec  AORTA Ao Root diam: 3.00 cm MITRAL VALVE               TRICUSPID VALVE MV Area (PHT): 2.70 cm    TR Peak grad:   21.7 mmHg MV Decel Time: 281 msec    TR Vmax:        233.00 cm/s MV E velocity: 80.60 cm/s MV A velocity: 76.30 cm/s  SHUNTS MV E/A ratio:  1.06        Systemic VTI: 0.17 m Dalton McleanMD Electronically signed by Wilfred Lacy Signature Date/Time:  11/29/2020/5:59:32 PM    Final    CT ANGIO HEAD NECK W WO CM (CODE STROKE)  Result Date: 11/29/2020 CLINICAL DATA:  Stroke follow-up EXAM: CT ANGIOGRAPHY HEAD AND NECK TECHNIQUE: Multidetector CT imaging of the head and neck was performed using the standard protocol during bolus administration of intravenous contrast. Multiplanar CT image reconstructions and MIPs were obtained to evaluate the vascular anatomy. Carotid stenosis measurements (when applicable) are obtained utilizing NASCET criteria, using the distal internal carotid diameter as the denominator. CONTRAST:  69mL OMNIPAQUE IOHEXOL 350 MG/ML SOLN COMPARISON:  Same-day MRA head, MR brain and orbits 11/28/2020 FINDINGS: CT HEAD FINDINGS Brain: Hypodensity in the left thalamus extending to the midbrain is consistent with evolving infarct as seen on the prior brain MRI. The additional small infarcts in the right thalamus is not well seen. There is no new acute territorial infarct. There is no acute intracranial hemorrhage. The ventricles are stable in size. There is no solid mass lesion. There is no midline shift. Vascular: No hyperdense vessel or unexpected calcification. Skull: Normal. Negative for fracture or focal lesion. Sinuses: The paranasal sinuses are  clear. Orbits: The globes and orbits are unremarkable. Review of the MIP images confirms the above findings CTA NECK FINDINGS Aortic arch: Standard branching. Imaged portion shows no evidence of aneurysm or dissection. No significant stenosis of the major arch vessel origins. Right carotid system: The right common, internal, and external carotid arteries are patent, without hemodynamically significant stenosis, occlusion, dissection, or aneurysm. Left carotid system: The left common, internal, and external carotid arteries are patent, without hemodynamically significant stenosis, occlusion, dissection, or aneurysm. Vertebral arteries: The proximal right V2 segment is suboptimally evaluated due to  significant streak artifact from adjacent venous contamination. The distal V2 and V3 segments are patent, without hemodynamically significant stenosis, occlusion, dissection, or aneurysm. The left vertebral artery is patent, without hemodynamically significant stenosis, occlusion, dissection, or aneurysm. Skeleton: There is no acute osseous abnormality or aggressive osseous lesion. There is no visible canal hematoma. Other neck: The soft tissues are unremarkable. Upper chest: The lung apices are clear. Review of the MIP images confirms the above findings CTA HEAD FINDINGS Anterior circulation: The intracranial ICAs are patent. The bilateral MCAs are patent. The bilateral ACAs are patent. There is no aneurysm. Posterior circulation: The bilateral V4 segments are patent. The PICA origins are patent bilaterally. The SCAs are patent bilaterally. The basilar artery is patent. The bilateral PCAs are patent. Bilateral posterior communicating arteries are identified. Venous sinuses: Patent. Anatomic variants: None. Review of the MIP images confirms the above findings IMPRESSION: 1. Expected evolution of the early subacute infarct in the left thalamus extending to the midbrain. The additional small infarcts in the right thalamus is not well seen. 2. Patent vasculature of the head and neck, with no stenosis, occlusion, or dissection. The bilateral PICA origins are patent. Electronically Signed   By: Lesia Hausen M.D.   On: 11/29/2020 10:51    PHYSICAL EXAM Pleasant middle-aged Hispanic lady not in distress. . Afebrile. Head is nontraumatic. Neck is supple without bruit.    Cardiac exam no murmur or gallop. Lungs are clear to auscultation. Distal pulses are well felt.  Neurological Exam ;  Awake  Alert oriented x 3. Normal speech and language.Marland Kitchen  Restriction of vertical gaze but preserved horizontal gaze without nystagmus.subjective vertical diplopia.  Fundi were not visualized. Vision acuity and fields appear normal.  Hearing is normal. Palatal movements are normal. Face symmetric. Tongue midline. Normal strength, tone, reflexes and coordination. Normal sensation. Gait deferred.  ASSESSMENT/PLAN Ms. Alexa Hatfield is a 50 y.o. 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 Symptoms onsent 11/19 while in the [place] with dizziness along with double vision.  She was seen there and reportedly had a negative CT scan and a neurology evaluation which was not concerning for stroke but was advised to stop BCPs. She flew back 11/21. Reported to urgent care then ED where MR brain with and without showed acute to early subacute infarct in the left midbrain, bilateral thalami and left frontal lobe.       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   Code Stroke CT head: not obtained.  MR brain and orbits w/wo:  Acute to early subacute infarcts in the left midbrain, thalami, and left frontal lobe. 3 mm focus of cortical enhancement in  the left temporal lobe without edema or diffusion abnormality. This is indeterminate but could reflect a subacute infarct or be vascular  Tiny chronic cerebellar infarcts. Unremarkable appearance of the orbits MRA brain Suspected occlusion of the proximal left PICA with normal flow related enhancement of its distal segment. 2D Echo PENDING TCD bubble PENDING  Hypercoag panel is PENDING LDL 117 HgbA1c 4.9 VTE prophylaxis - is recommended, per primary team     Diet   Diet NPO time specified   No anticoagulant or antiplatelet prior to admission Now on ASA 81mg  and Plavix 75mg  daily pending work up Therapy recommendations:  Outpatient OT/Pending Disposition:  TBD  BP  management Stable Permissive hypertension (OK if < 220/120) but gradually normalize in 5-7 days Long-term BP goal normotensive  Hyperlipidemia Home meds:  none LDL 117, goal < 70 High intensity statin Lipitor 40mg  Continue statin at discharge  Other Stroke Risk Factors UDS is pending (discussed with RN x2)  Other Active Problems  Hospital day # 1  Henery Betzold A Bailey-Modzik, NP-C    To contact Stroke Continuity provider, please refer to . After hours, contact General Neurology

## 2020-11-30 NOTE — Progress Notes (Signed)
Inpatient Rehab Admissions Coordinator:   Per OT note patient was screened for CIR candidacy by Megan Salon, MS, CCC-SLP. At this time,  PT is recommending outpatient follow up. I cannot get auth from [employer] without CIR recs from both OT and PT, so I will rescreen on Friday if PT has updated their recommendation to AIR/CIR but will not pursue consult at this time.    Megan Salon, MS, CCC-SLP Rehab Admissions Coordinator  [phone] (celll) [phone] (office)

## 2020-11-30 NOTE — Transfer of Care (Signed)
Immediate Anesthesia Transfer of Care Note  Patient: Alexa Hatfield  Procedure(s) Performed: TRANSESOPHAGEAL ECHOCARDIOGRAM (TEE) BUBBLE STUDY  Patient Location: Endoscopy Unit  Anesthesia Type:MAC  Level of Consciousness: oriented, drowsy and patient cooperative  Airway & Oxygen Therapy: Patient Spontanous Breathing and Patient connected to nasal cannula oxygen  Post-op Assessment: Report given to RN and Post -op Vital signs reviewed and stable  Post vital signs: Reviewed  Last Vitals:  Vitals Value Taken Time  BP    Temp    Pulse 69 11/30/20 1408  Resp 21 11/30/20 1408  SpO2 100 % 11/30/20 1408  Vitals shown include unvalidated device data.  Last Pain:  Vitals:   11/30/20 1219  TempSrc:   PainSc: 0-No pain         Complications: No notable events documented.

## 2020-11-30 NOTE — Progress Notes (Signed)
Occupational Therapy Treatment Patient Details Name: Alexa Hatfield MRN: [id number] DOB: [birth year] Today's Date: 11/30/2020   History of present illness 50 yo admitted with double vision and fatigue. 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 PMH: complicated [place]; finger amputation   OT comments  Alexa Hatfield (Interpreter) used throughout session to further assess cognition and perception. Pt with apparent cognitive and visual perceptual deficits as noted below. Unsteady gait, requiring up to mod A to prevent falls this session. Poor awareness of deficits/flat affect. Complaining of vertical diplopia, however use of partial occlusion appears to be helping functional vision. Updating DC plan to CIR given significant change in functional status. Will continue to follow acutely.    Recommendations for follow up therapy are one component of a multi-disciplinary discharge planning process, led by the attending physician.  Recommendations may be updated based on patient status, additional functional criteria and insurance authorization.    Follow Up Recommendations  Acute inpatient rehab (3hours/day)    Assistance Recommended at Discharge Frequent or constant Supervision/Assistance  Equipment Recommendations  BSC/3in1    Recommendations for Other Services      Precautions / Restrictions Precautions Precautions: Fall Precaution Comments: double vision; parital occlusion glasses Restrictions Weight Bearing Restrictions: No       Mobility Bed Mobility Overal bed mobility: Modified Independent                  Transfers Overall transfer level: Needs assistance   Transfers: Sit to/from Stand Sit to Stand: Min guard                 Balance Overall balance assessment: Needs assistance   Sitting balance-Leahy Scale: Good       Standing balance-Leahy Scale: Poor                              ADL either performed or assessed with clinical judgement   ADL                           Toilet Transfer: Ambulation;Minimal assistance   Toileting- Clothing Manipulation and Hygiene: Min guard       Functional mobility during ADLs: Moderate assistance General ADL Comments: Ambulated to toilet - bumping into counter on R; walked to door adn required mod A to prevent fall    Extremity/Trunk Assessment Upper Extremity Assessment Upper Extremity Assessment: RUE deficits/detail RUE Deficits / Details: strength and ROM overall WFL; at times appears to spontaneously use non-dominant LUE rather than R - ? mild inattention            Vision   Vision Assessment?: Yes; nystagmus noted Additional Comments: Continues to have double vision; interchanging glasses appropriately; will need to educate on progressing partial occlusion; Interpreter will work on Interior and spatial designer Perception: Impaired (unable to complete clock draw - required repetitive cues to place numbers on circle like you would see on a clock; numbers poorly spaced; unable to put hands on a clock, even after demonstration - ? motor planning deficits?) Letter cancellation - ale to identify targets however unable to put mark "over" target - misplaced off to side; at one point staes words are "moving", then states they are still   Praxis Praxis Praxis: Impaired Praxis Impairment Details: Perseveration;Motor planning (will further assess)  Cognition Arousal/Alertness: Awake/alert Behavior During Therapy: Flat affect Overall Cognitive Status: Impaired/Different from baseline Area of Impairment: Attention;Safety/judgement;Awareness;Problem solving                   Current Attention Level: Selective     Safety/Judgement: Decreased awareness of safety;Decreased awareness of deficits Awareness: Emergent Problem Solving: Slow processing General  Comments: unable to state months of the year in reverse order, perseveration noted/said "September @ 8 times" before attempting to move on; unable to complete sequential substraction, errors with subtraction, then began to add, then subtract - difficulty with staying on task and sequencing; pt states the "only thing that is wrong is her vision"; almost fell x 2; required cues to not run into counter on R          Exercises     Shoulder Instructions       General Comments      Pertinent Vitals/ Pain       Pain Assessment: No/denies pain  Home Living                                          Prior Functioning/Environment              Frequency  Min 2X/week        Progress Toward Goals  OT Goals(current goals can now be found in the care plan section)  Progress towards OT goals: Progressing toward goals  Acute Rehab OT Goals Patient Stated Goal: for her vision to get better OT Goal Formulation: With patient Time For Goal Achievement: 12/13/20 Potential to Achieve Goals: Good ADL Goals Pt Will Perform Lower Body Dressing: with modified independence;sit to/from stand Pt Will Transfer to Toilet: with modified independence;ambulating Additional ADL Goal #1: Pt will independently demonstrate ability to manage partial occlusion to improve funcitonal vision Additional ADL Goal #2: Pt will independently verbalize 3 strateiges to reduce risk of falls  Plan Discharge plan needs to be updated    Co-evaluation                 AM-PAC OT "6 Clicks" Daily Activity     Outcome Measure   Help from another person eating meals?: None Help from another person taking care of personal grooming?: A Little Help from another person toileting, which includes using toliet, bedpan, or urinal?: A Lot Help from another person bathing (including washing, rinsing, drying)?: A Little Help from another person to put on and taking off regular upper body clothing?: A  Little Help from another person to put on and taking off regular lower body clothing?: A Little 6 Click Score: 18    End of Session Equipment Utilized During Treatment: Gait belt  OT Visit Diagnosis: Unsteadiness on feet (R26.81);Muscle weakness (generalized) (M62.81);Other abnormalities of gait and mobility (R26.89);Low vision, both eyes (H54.2);Other symptoms and signs involving cognitive function;Other symptoms and signs involving the nervous system (R29.898);Apraxia (R48.2)   Activity Tolerance Patient tolerated treatment well   Patient Left in bed;with call bell/phone within reach;with bed alarm set   Nurse Communication Mobility status;Other (comment) (recommend CIR)        Time: 1610-9604 OT Time Calculation (min): 38 min  Charges: OT General Charges $OT Visit: 1 Visit OT Treatments $Self Care/Home Management : 8-22 mins $Therapeutic Activity: 23-37 mins  Alexa Hatfield, OT/L   Acute OT Clinical Specialist Acute Rehabilitation Services Pager [phone] Office [phone]  Alexa Hatfield,Alexa Hatfield 11/30/2020, 10:18 AM

## 2020-11-30 NOTE — Plan of Care (Signed)
  Problem: Education: Goal: Knowledge of disease or condition will improve Outcome: Progressing Goal: Knowledge of patient specific risk factors will improve (INDIVIDUALIZE FOR PATIENT) Outcome: Progressing   Problem: Coping: Goal: Will verbalize positive feelings about self Outcome: Progressing   Problem: Self-Care: Goal: Ability to participate in self-care as condition permits will improve Outcome: Progressing   Problem: Nutrition: Goal: Risk of aspiration will decrease Outcome: Progressing   Problem: Ischemic Stroke/TIA Tissue Perfusion: Goal: Complications of ischemic stroke/TIA will be minimized Outcome: Progressing

## 2020-11-30 NOTE — Anesthesia Procedure Notes (Signed)
Procedure Name: MAC Date/Time: 11/30/2020 1:15 PM Performed by: Jenne Campus, CRNA Pre-anesthesia Checklist: Patient identified, Emergency Drugs available, Suction available and Patient being monitored Oxygen Delivery Method: Nasal cannula

## 2020-11-30 NOTE — Procedures (Signed)
     Transesophageal Echocardiogram Note  Alexa Hatfield [id number] 04/06/1970  Procedure: Transesophageal Echocardiogram Indications: Stroke  Procedure Details Consent: Obtained Time Out: Verified patient identification, verified procedure, site/side was marked, verified correct patient position, special equipment/implants available, Radiology Safety Procedures followed,  medications/allergies/relevent history reviewed, required imaging and test results available.  Performed  Medications: Propofol administered by CV anesthesia  Left Ventrical:  LVEF 60-65%  Mitral Valve: Mild thickening. Trivial MR  Aortic Valve: Trileaflet. Mild AR  Tricuspid Valve: Normal structure. Trivial TR  Pulmonic Valve: Normal structure. No PI  Left Atrium/ Left atrial appendage: Normal size LA. No LAA thrombus  Atrial septum: Aneurysmal but no PFO seen by color doppler. 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 Unlikely to be source of stroke  Aorta: Mild plaquing.   Complications: No apparent complications Patient did tolerate procedure well.  Alexa Sprague, MD 11/30/2020, 2:16 PM

## 2020-11-30 NOTE — Progress Notes (Signed)
PROGRESS NOTE  Alexa Hatfield DJS:[id number] DOB: 1970-03-20 DOA: 11/28/2020 PCP: Pcp, No  HPI/Recap of past 24 hours: Alexa Hatfield is an 50 y.o. female no significant past medical history comes into the hospital for evaluation of diplopia and fatigue that started 3 days prior to admission CT scan of the head in the [place] showed no acute findings according to the patient.  And symptoms were attributed to birth control pills.  In the ED she was found to have a bicarbonate of 17 MRI of the brain was concerning for acute early subacute infarct of the left midbrain thalamus and left frontal lobe as well as 3 mm focus cortical enhancement of the left temporal lobe without edema, MRI of the orbit appeared unremarkable.  11/30/2020: Seen after her TEE.  It shows aneurysmal interatrial septum with possibly a small PFO.  Reports dizziness.  Discussed with neurology, recommends aspirin 81 mg daily and Plavix 75 mg daily x21 days then aspirin 81 mg daily alone indefinitely.    Assessment/Plan: Principal Problem:   Ischemic stroke [facility]) Active Problems:   CVA (cerebral vascular accident) (HCC)  Acute multiple Ischemic stroke (HCC): Concern about embolic in nature. She started taking oral contraceptive pills that she bought in the [place] HgbA1c 4.9, fasting lipid panel HDL less than 40 LDL greater than 100. MRA of the brain without contrast showed acute to early subacute infarcts in the left midbrain, thalami, left frontal lobe.  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 Hypercoagulable panel is pending. TEE showed aneurysmal interatrial septum with small PFO unlikely to be clinically significant. LDL 117, goal less than 70. Hemoglobin A1c 4.9, goal of less than 7.0.  Hyperlipidemia LDL 117 with goal less than 70 Continue Lipitor 40 mg daily.   Mild metabolic acidosis normal anion gap: Repeat BMP in the  morning  Hyperbilirubinemia T bilirubin 1.7 Repeat in the morning.     DVT prophylaxis: lovenox subcu daily Family Communication:daughter Status is: Observation       Code Status: Full code  Family Communication: None at bedside  Disposition Plan: Likely will discharge to CIR if insurance approves and bed is available.   Consultants: Neurology, signed off.  Procedures: 2D echo, TEE  Antimicrobials: None  DVT prophylaxis: Subcu Lovenox daily.  Status is: Inpatient  Inpatient status.  Patient will require at least 2 midnights for further evaluation and treatment of present condition.      Objective: Vitals:   11/30/20 1219 11/30/20 1400 11/30/20 1410 11/30/20 1420  BP: 127/84 121/70 119/68 120/70  Pulse: 77 65 64 (!) 57  Resp: 16 15 (!) 25 (!) 23  Temp:  (!) 97.1 F (36.2 C)    TempSrc:  Temporal    SpO2: 98% 100% 100% 100%  Weight: 67 kg     Height: 5\' 3"  (1.6 m)       Intake/Output Summary (Last 24 hours) at 11/30/2020 1618 Last data filed at 11/30/2020 1355 Gross per 24 hour  Intake 559.95 ml  Output 200 ml  Net 359.95 ml   Filed Weights   11/28/20 1127 11/30/20 1219  Weight: 67 kg 67 kg    Exam:  General: 50 y.o. year-old female well developed well nourished in no acute distress.  Alert and oriented x3. Cardiovascular: Regular rate and rhythm with no rubs or gallops.  No thyromegaly or JVD noted.   Respiratory: Clear to auscultation with no wheezes or rales. Good inspiratory  effort. Abdomen: Soft nontender nondistended with normal bowel sounds x4 quadrants. Musculoskeletal: No lower extremity edema. 2/4 pulses in all 4 extremities. Skin: No ulcerative lesions noted or rashes, Psychiatry: Mood is appropriate for condition and setting   Data Reviewed: CBC: Recent Labs  Lab 11/28/20 1406 11/29/20 0346  WBC 9.5 10.6*  NEUTROABS 7.4  --   HGB 13.6 13.7  HCT 40.7 43.1  MCV 94.0 97.5  PLT 234 236   Basic Metabolic Panel: Recent  Labs  Lab 11/28/20 1406 11/29/20 0346  NA 137 137  K 3.8 3.8  CL 112* 111  CO2 17* 18*  GLUCOSE 91 91  BUN 9 13  CREATININE 0.95 0.97  CALCIUM 8.9 9.4   GFR: Estimated Creatinine Clearance: 64.5 mL/min (by C-G formula based on SCr of 0.97 mg/dL). Liver Function Tests: Recent Labs  Lab 11/29/20 0346  AST 21  ALT 27  ALKPHOS 74  BILITOT 1.7*  PROT 7.2  ALBUMIN 3.5   No results for input(s): LIPASE, AMYLASE in the last 168 hours. No results for input(s): AMMONIA in the last 168 hours. Coagulation Profile: No results for input(s): INR, PROTIME in the last 168 hours. Cardiac Enzymes: No results for input(s): CKTOTAL, CKMB, CKMBINDEX, TROPONINI in the last 168 hours. BNP (last 3 results) No results for input(s): PROBNP in the last 8760 hours. HbA1C: Recent Labs    11/29/20 0346  HGBA1C 4.9   CBG: No results for input(s): GLUCAP in the last 168 hours. Lipid Profile: Recent Labs    11/29/20 0346  CHOL 177  HDL 35*  LDLCALC 117*  TRIG 123  CHOLHDL 5.1   Thyroid Function Tests: Recent Labs    11/29/20 0346  TSH 3.244   Anemia Panel: No results for input(s): VITAMINB12, FOLATE, FERRITIN, TIBC, IRON, RETICCTPCT in the last 72 hours. Urine analysis: No results found for: COLORURINE, APPEARANCEUR, LABSPEC, PHURINE, GLUCOSEU, HGBUR, BILIRUBINUR, KETONESUR, PROTEINUR, UROBILINOGEN, NITRITE, LEUKOCYTESUR Sepsis Labs: @LABRCNTIP (procalcitonin:4,lacticidven:4)  ) Recent Results (from the past 240 hour(s))  Resp Panel by RT-PCR (Flu A&B, Covid) Nasopharyngeal Swab     Status: None   Collection Time: 11/28/20  9:23 PM   Specimen: Nasopharyngeal Swab; Nasopharyngeal(NP) swabs in vial transport medium  Result Value Ref Range Status   SARS Coronavirus 2 by RT PCR NEGATIVE NEGATIVE Final    Comment: (NOTE) SARS-CoV-2 target nucleic acids are NOT DETECTED.  The SARS-CoV-2 RNA is generally detectable in upper respiratory specimens during the acute phase of infection.  The lowest concentration of SARS-CoV-2 viral copies this assay can detect is 138 copies/mL. A negative result does not preclude SARS-Cov-2 infection and should not be used as the sole basis for treatment or other patient management decisions. A negative result may occur with  improper specimen collection/handling, submission of specimen other than nasopharyngeal swab, presence of viral mutation(s) within the areas targeted by this assay, and inadequate number of viral copies(<138 copies/mL). A negative result must be combined with clinical observations, patient history, and epidemiological information. The expected result is Negative.  Fact Sheet for Patients:  11/30/20  Fact Sheet for Healthcare Providers:  BloggerCourse.com  This test is no t yet approved or cleared by the [employer] FDA and  has been authorized for detection and/or diagnosis of SARS-CoV-2 by FDA under an Emergency Use Authorization (EUA). This EUA will remain  in effect (meaning this test can be used) for the duration of the COVID-19 declaration under Section 564(b)(1) of the Act, 21 U.S.C.section 360bbb-3(b)(1), unless the authorization is terminated  or revoked sooner.       Influenza A by PCR NEGATIVE NEGATIVE Final   Influenza B by PCR NEGATIVE NEGATIVE Final    Comment: (NOTE) The Xpert Xpress SARS-CoV-2/FLU/RSV plus assay is intended as an aid in the diagnosis of influenza from Nasopharyngeal swab specimens and should not be used as a sole basis for treatment. Nasal washings and aspirates are unacceptable for Xpert Xpress SARS-CoV-2/FLU/RSV testing.  Fact Sheet for Patients: BloggerCourse.com  Fact Sheet for Healthcare Providers: SeriousBroker.it  This test is not yet approved or cleared by the [place] FDA and has been authorized for detection and/or diagnosis of SARS-CoV-2 by FDA  under an Emergency Use Authorization (EUA). This EUA will remain in effect (meaning this test can be used) for the duration of the COVID-19 declaration under Section 564(b)(1) of the Act, 21 U.S.C. section 360bbb-3(b)(1), unless the authorization is terminated or revoked.  Performed at [facility], [address], [place], [place] [address]       Studies: ECHOCARDIOGRAM COMPLETE  Result Date: 11/29/2020    ECHOCARDIOGRAM REPORT   Patient Name:   BOWEN GOYAL Date of Exam: 11/29/2020 Medical Rec #:  [id number]          Height:       63.0 in Accession #:    [id number]         Weight:       147.7 lb Date of Birth:  [birth year]         BSA:          1.700 m Patient Age:    49 years           BP:           111/79 mmHg Patient Gender: F                  HR:           67 bpm. Exam Location:  Inpatient Procedure: 2D Echo, Cardiac Doppler and Color Doppler Indications:    Stroke  History:        Patient has no prior history of Echocardiogram examinations.  Sonographer:    Roosvelt Maser RDCS Referring Phys: [phone] ABRAHAM FELIZ ORTIZ IMPRESSIONS  1. Left ventricular ejection fraction, by estimation, is 55 to 60%. The left ventricle has normal function. The left ventricle has no regional wall motion abnormalities. Left ventricular diastolic parameters were normal.  2. Right ventricular systolic function is normal. The right ventricular size is normal. There is normal pulmonary artery systolic pressure. The estimated right ventricular systolic pressure is 24.7 mmHg.  3. The mitral valve is normal in structure. No evidence of mitral valve regurgitation. No evidence of mitral stenosis.  4. The aortic valve is tricuspid. Aortic valve regurgitation is mild. No aortic stenosis is present.  5. The inferior vena cava is normal in size with greater than 50% respiratory variability, suggesting right atrial pressure of 3 mmHg. FINDINGS  Left Ventricle: Left ventricular ejection fraction, by estimation, is 55 to  60%. The left ventricle has normal function. The left ventricle has no regional wall motion abnormalities. The left ventricular internal cavity size was normal in size. There is  no left ventricular hypertrophy. Left ventricular diastolic parameters were normal. Right Ventricle: The right ventricular size is normal. No increase in right ventricular wall thickness. Right ventricular systolic function is normal. There is normal pulmonary artery systolic pressure. The tricuspid regurgitant velocity is 2.33 m/s, and  with an assumed right atrial  pressure of 3 mmHg, the estimated right ventricular systolic pressure is 24.7 mmHg. Left Atrium: Left atrial size was normal in size. Right Atrium: Right atrial size was normal in size. Pericardium: There is no evidence of pericardial effusion. Mitral Valve: The mitral valve is normal in structure. No evidence of mitral valve regurgitation. No evidence of mitral valve stenosis. Tricuspid Valve: The tricuspid valve is normal in structure. Tricuspid valve regurgitation is trivial. Aortic Valve: The aortic valve is tricuspid. Aortic valve regurgitation is mild. Aortic regurgitation PHT measures 600 msec. No aortic stenosis is present. Pulmonic Valve: The pulmonic valve was normal in structure. Pulmonic valve regurgitation is not visualized. Aorta: The aortic root is normal in size and structure. Venous: The inferior vena cava is normal in size with greater than 50% respiratory variability, suggesting right atrial pressure of 3 mmHg. IAS/Shunts: No atrial level shunt detected by color flow Doppler.  LEFT VENTRICLE PLAX 2D LVIDd:         3.40 cm     Diastology LVIDs:         2.20 cm     LV e' medial:    8.38 cm/s LV PW:         0.90 cm     LV E/e' medial:  9.6 LV IVS:        1.00 cm     LV e' lateral:   12.70 cm/s                            LV E/e' lateral: 6.3  LV Volumes (MOD) LV vol d, MOD A2C: 64.9 ml LV vol d, MOD A4C: 61.3 ml LV vol s, MOD A2C: 17.2 ml LV vol s, MOD A4C: 18.2  ml LV SV MOD A2C:     47.7 ml LV SV MOD A4C:     61.3 ml LV SV MOD BP:      46.4 ml RIGHT VENTRICLE RV Basal diam:  2.70 cm RV S prime:     11.60 cm/s TAPSE (M-mode): 2.2 cm LEFT ATRIUM             Index        RIGHT ATRIUM           Index LA diam:        3.60 cm 2.12 cm/m   RA Area:     10.50 cm LA Vol (A2C):   36.1 ml 21.24 ml/m  RA Volume:   21.80 ml  12.82 ml/m LA Vol (A4C):   25.8 ml 15.18 ml/m LA Biplane Vol: 32.7 ml 19.24 ml/m  AORTIC VALVE LVOT Vmax:   96.00 cm/s LVOT Vmean:  66.900 cm/s LVOT VTI:    0.172 m AI PHT:      600 msec  AORTA Ao Root diam: 3.00 cm MITRAL VALVE               TRICUSPID VALVE MV Area (PHT): 2.70 cm    TR Peak grad:   21.7 mmHg MV Decel Time: 281 msec    TR Vmax:        233.00 cm/s MV E velocity: 80.60 cm/s MV A velocity: 76.30 cm/s  SHUNTS MV E/A ratio:  1.06        Systemic VTI: 0.17 m [employer] Electronically signed by Wilfred Lacy Signature Date/Time: 11/29/2020/5:59:32 PM    Final     Scheduled Meds:  aspirin EC  81 mg Oral Daily   atorvastatin  40 mg Oral Daily   clopidogrel  75 mg Oral Daily   enoxaparin (LOVENOX) injection  40 mg Subcutaneous Q24H    Continuous Infusions:   LOS: 1 day     Darlin Drop, MD [facility] Pager [phone]  If 7PM-7AM, please contact night-coverage [email or web address] Password [facility] 11/30/2020, 4:18 PM

## 2020-11-30 NOTE — TOC Initial Note (Signed)
Transition of Care [facility]) - Initial/Assessment Note    Patient Details  Name: Alexa Hatfield MRN: [id number] Date of Birth: Sep 19, 1970  Transition of Care [facility]) CM/SW Contact:    Pollie Friar, RN Phone Number: 11/30/2020, 2:48 PM  Clinical Narrative:                 CM met with the patient and her son and daughter. Patient is Spanish speaking but children interpreted. She lives with her mother but the kids can assist as needed at home.  No DME and no issues with medications at home.  Patient agreeable to outpatient therapy and would like the ordered DME prior to d/c home.  TOC following.  Expected Discharge Plan: OP Rehab Barriers to Discharge: Continued Medical Work up   Patient Goals and CMS Choice   CMS Medicare.gov Compare Post Acute Care list provided to:: Patient Represenative (must comment) Choice offered to / list presented to : Adult Children  Expected Discharge Plan and Services Expected Discharge Plan: OP Rehab   Discharge Planning Services: CM Consult Post Acute Care Choice: Durable Medical Equipment Living arrangements for the past 2 months: Single Family Home                                      Prior Living Arrangements/Services Living arrangements for the past 2 months: Single Family Home Lives with:: Parents Patient language and need for interpreter reviewed:: Yes (Spanish) Do you feel safe going back to the place where you live?: Yes      Need for Family Participation in Patient Care: Yes (Comment) Care giver support system in place?: Yes (comment)   Criminal Activity/Legal Involvement Pertinent to Current Situation/Hospitalization: No - Comment as needed  Activities of Daily Living Home Assistive Devices/Equipment: None ADL Screening (condition at time of admission) Patient's cognitive ability adequate to safely complete daily activities?: Yes Is the patient deaf or have difficulty hearing?: No Does the patient have difficulty  seeing, even when wearing glasses/contacts?: No Does the patient have difficulty concentrating, remembering, or making decisions?: No Patient able to express need for assistance with ADLs?: Yes Does the patient have difficulty dressing or bathing?: No Independently performs ADLs?: Yes (appropriate for developmental age) Does the patient have difficulty walking or climbing stairs?: No Weakness of Legs: None Weakness of Arms/Hands: None  Permission Sought/Granted                  Emotional Assessment Appearance:: Appears stated age     Orientation: : Oriented to Self, Oriented to Place, Oriented to  Time, Oriented to Situation   Psych Involvement: No (comment)  Admission diagnosis:  Diplopia [H53.2] CVA (cerebral vascular accident) ([place]) [I63.9] Ischemic stroke ([place]) [I63.9] Cerebrovascular accident (CVA), unspecified mechanism ([place]) [I63.9] Patient Active Problem List   Diagnosis Date Noted   CVA (cerebral vascular accident) ([place]) 11/29/2020   Ischemic stroke ([place]) 11/28/2020   PCP:  Pcp, No Pharmacy:  No Pharmacies Listed    Social Determinants of Health (SDOH) Interventions    Readmission Risk Interventions No flowsheet data found.

## 2020-11-30 NOTE — Progress Notes (Signed)
  Echocardiogram Echocardiogram Transesophageal has been performed.  Alexa Hatfield 11/30/2020, 2:04 PM

## 2020-11-30 NOTE — Interval H&P Note (Signed)
History and Physical Interval Note:  11/30/2020 12:50 PM  Alexa Hatfield  has presented today for surgery, with the diagnosis of STROKE.  The various methods of treatment have been discussed with the patient and family. After consideration of risks, benefits and other options for treatment, the patient has consented to  Procedure(s): TRANSESOPHAGEAL ECHOCARDIOGRAM (TEE) (N/A) as a surgical intervention.  The patient's history has been reviewed, patient examined, no change in status, stable for surgery.  I have reviewed the patient's chart and labs.  Questions were answered to the patient's satisfaction.     [name]

## 2020-12-01 DIAGNOSIS — I639 Cerebral infarction, unspecified: Secondary | ICD-10-CM | POA: Diagnosis not present

## 2020-12-01 LAB — COMPREHENSIVE METABOLIC PANEL
ALT: 20 U/L (ref 0–44)
AST: 15 U/L (ref 15–41)
Albumin: 3.1 g/dL — ABNORMAL LOW (ref 3.5–5.0)
Alkaline Phosphatase: 70 U/L (ref 38–126)
Anion gap: 6 (ref 5–15)
BUN: 13 mg/dL (ref 6–20)
CO2: 22 mmol/L (ref 22–32)
Calcium: 8.7 mg/dL — ABNORMAL LOW (ref 8.9–10.3)
Chloride: 110 mmol/L (ref 98–111)
Creatinine, Ser: 0.84 mg/dL (ref 0.44–1.00)
GFR, Estimated: >60 mL/min (ref >60)
Glucose, Bld: 94 mg/dL (ref 70–99)
Potassium: 3.8 mmol/L (ref 3.5–5.1)
Sodium: 138 mmol/L (ref 135–145)
Total Bilirubin: 1.8 mg/dL — ABNORMAL HIGH (ref 0.3–1.2)
Total Protein: 6.7 g/dL (ref 6.5–8.1)

## 2020-12-01 LAB — CBC
HCT: 37.7 % (ref 36.0–46.0)
Hemoglobin: 12.4 g/dL (ref 12.0–15.0)
MCH: 30.8 pg (ref 26.0–34.0)
MCHC: 32.9 g/dL (ref 30.0–36.0)
MCV: 93.8 fL (ref 80.0–100.0)
Platelets: 235 10*3/uL (ref 150–400)
RBC: 4.02 MIL/uL (ref 3.87–5.11)
RDW: 13.2 % (ref 11.5–15.5)
WBC: 10.3 10*3/uL (ref 4.0–10.5)
nRBC: 0 % (ref 0.0–0.2)

## 2020-12-01 LAB — MAGNESIUM: Magnesium: 2 mg/dL (ref 1.7–2.4)

## 2020-12-01 LAB — PROTEIN C ACTIVITY: Protein C Activity: 124 % (ref 73–180)

## 2020-12-01 LAB — PROTEIN S ACTIVITY: Protein S Activity: 68 % (ref 63–140)

## 2020-12-01 NOTE — Progress Notes (Signed)
Physical Therapy Treatment Patient Details Name: Alexa Hatfield MRN: [id number] DOB: 03-15-1970 Today's Date: 12/01/2020   History of Present Illness 50 yo admitted 11/28/20 with double vision and fatigue. 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 S/p TEE 11/23. PMH: complicated [place]; finger amputation    PT Comments    Utilized in-person interpreter, Ashby Dawes, throughout session. Pt with noted significant deficits in memory, processing speed, problem-solving, and safety awareness. Pt needing repeated cues/reminders and repetition of commands >1 throughout session. In addition, she required max cues to problem-solve how to find her way back to her room. Pt also with significant static and dynamic balance deficits, displaying LOB bouts repeatedly when standing statically or ambulating, requiring pt to use UE support or min-modA to regain her balance. Pt with particular difficulty changing head positions or changing directions without losing balance. Considering her significant deficits in cognition and balance that place her at high risk for falls and injuries, pt's significant decline in functional status as she was working [employer] se[employer] PTA, pt's motivation to improve, and pt able to tolerate increased therapy she would greatly benefit from intensive therapy in the inpatient rehab setting to maximize her independence and safety with all functional mobility. Updated d/c recs. Will continue to follow acutely.    Recommendations for follow up therapy are one component of a multi-disciplinary discharge planning process, led by the attending physician.  Recommendations may be updated based on patient status, additional functional criteria and insurance authorization.  Follow Up Recommendations  Acute inpatient rehab (3hours/day)     Assistance Recommended at Discharge Frequent or constant  Supervision/Assistance  Equipment Recommendations  Rolling walker (2 wheels)    Recommendations for Other Services Rehab consult     Precautions / Restrictions Precautions Precautions: Fall Precaution Comments: double vision; parital occlusion glasses Restrictions Weight Bearing Restrictions: No     Mobility  Bed Mobility Overal bed mobility: Modified Independent             General bed mobility comments: Extra time but able to come to sit L EOB without assistance.    Transfers Overall transfer level: Needs assistance   Transfers: Sit to/from Stand Sit to Stand: Min guard           General transfer comment: Min guard for safety, unsteadiness noted but no LOB.    Ambulation/Gait Ambulation/Gait assistance: Min guard;Mod assist;Min assist Gait Distance (Feet): 225 Feet Assistive device: None;1 person hand held assist Gait Pattern/deviations: Step-through pattern;Decreased stride length;Staggering right;Drifts right/left;Scissoring Gait velocity: Decreased Gait velocity interpretation: <1.31 ft/sec, indicative of household ambulator   General Gait Details: Pt with lateral staggering, primarily to R, and intermittent L foot scissioring, likely to try to regain balance. Pt with slow reactional strategies, catching self with arm against wall often or needing up to min-modA to maintain balance, at least x7 bouts of staggering/LOB. Pt unable to change head positions more than 2x and ambulate simultaneously, requiring increased concentration by pt to maintain balance. Pt with increased unsteadiness and LOB when turning and weaving between obstacles.   Stairs Stairs: Yes Stairs assistance: Min assist Stair Management: Forwards;No rails;Step to pattern Number of Stairs: 2 General stair comments: Ascends and descends with unsteadiness and LOB, needing minA to recover.   Wheelchair Mobility    Modified Rankin (Stroke Patients Only) Modified Rankin (Stroke Patients  Only) Pre-Morbid Rankin Score: No symptoms Modified Rankin: Moderately severe disability  Balance Overall balance assessment: Needs assistance Sitting-balance support: No upper extremity supported Sitting balance-Leahy Scale: Good     Standing balance support: No upper extremity supported Standing balance-Leahy Scale: Fair Standing balance comment: LOB occurs standing statically or with dynamic mobility, needing up to min-modA to regain balance often.                            Cognition Arousal/Alertness: Awake/alert Behavior During Therapy: Flat affect Overall Cognitive Status: Impaired/Different from baseline Area of Impairment: Attention;Safety/judgement;Awareness;Problem solving;Memory;Following commands                   Current Attention Level: Selective Memory: Decreased short-term memory;Decreased recall of precautions Following Commands: Follows multi-step commands inconsistently;Follows multi-step commands with increased time;Follows one step commands inconsistently Safety/Judgement: Decreased awareness of safety;Decreased awareness of deficits Awareness: Emergent Problem Solving: Slow processing;Difficulty sequencing;Requires verbal cues General Comments: Pt repeatedly forgetting or deciding not to following simple command to notify PT when pt was done on toilet, repeatedly catching pt getting up before notifying PT, indicating memory and safety awareness deficits. Pt unable to follow/remember 2-3 step commands, forgetting to take second right at nurse's station. Pt needing max cues to problem-solve to use room number signs to find room.        Exercises      General Comments General comments (skin integrity, edema, etc.): Graciela interpreter used during session; Pt closes R eye often during session due to diplopia      Pertinent Vitals/Pain Pain Assessment: No/denies pain    Home Living                          Prior Function             PT Goals (current goals can now be found in the care plan section) Acute Rehab PT Goals Patient Stated Goal: to brush her teeth PT Goal Formulation: With patient Time For Goal Achievement: 12/13/20 Potential to Achieve Goals: Good Progress towards PT goals: Progressing toward goals    Frequency    Min 4X/week      PT Plan Discharge plan needs to be updated    Co-evaluation              AM-PAC PT "6 Clicks" Mobility   Outcome Measure  Help needed turning from your back to your side while in a flat bed without using bedrails?: None Help needed moving from lying on your back to sitting on the side of a flat bed without using bedrails?: None Help needed moving to and from a bed to a chair (including a wheelchair)?: A Little Help needed standing up from a chair using your arms (e.g., wheelchair or bedside chair)?: A Little Help needed to walk in hospital room?: A Lot Help needed climbing 3-5 steps with a railing? : A Little 6 Click Score: 19    End of Session Equipment Utilized During Treatment: Gait belt Activity Tolerance: Patient tolerated treatment well Patient left: in chair;with call bell/phone within reach;with chair alarm set Nurse Communication: Mobility status PT Visit Diagnosis: Unsteadiness on feet (R26.81);Difficulty in walking, not elsewhere classified (R26.2);Other abnormalities of gait and mobility (R26.89);Other symptoms and signs involving the nervous system (B44.967)     Time: 5916-3846 PT Time Calculation (min) (ACUTE ONLY): 26 min  Charges:  $Gait Training: 23-37 mins  Raymond Gurney, PT, DPT Acute Rehabilitation Services  Pager: [phone] Office: [phone]    Jewel Baize 12/01/2020, 11:13 AM

## 2020-12-01 NOTE — Progress Notes (Signed)
PROGRESS NOTE  Alexa Hatfield ZOX:[id number] DOB: June 11, 1970 DOA: 11/28/2020 PCP: Pcp, No  HPI/Recap of past 24 hours: Alexa Hatfield is an 50 y.o. female no significant past medical history comes into the hospital for evaluation of diplopia and fatigue that started 3 days prior to admission CT scan of the head in the [place] showed no acute findings according to the patient.  And symptoms were attributed to birth control pills.  In the ED she was found to have a bicarbonate of 17 MRI of the brain was concerning for acute early subacute infarct of the left midbrain thalamus and left frontal lobe as well as 3 mm focus cortical enhancement of the left temporal lobe without edema, MRI of the orbit appeared unremarkable.  Post TEE on 11/30/2020 which shows aneurysmal interatrial septum with possibly a small PFO.  Per neurology and cardiology unlikely to be source of stroke.    Neurology, recommends aspirin 81 mg daily and Plavix 75 mg daily x21 days then aspirin 81 mg daily alone indefinitely.  Seen by PT OT with recommendation for CIR.  Will be evaluated by CI on 12/02/2020.  12/01/2020: Seen at bedside.  Her son and mother were present in the room.  She had no new complaints.  Assessment/Plan: Principal Problem:   Ischemic stroke [facility]) Active Problems:   CVA (cerebral vascular accident) (HCC)  Acute multiple Ischemic stroke (HCC): Concern about embolic in nature. She started taking oral contraceptive pills that she bought in the [place]. HgbA1c 4.9, fasting lipid panel HDL less than 40 LDL greater than 100. MRA of the brain without contrast showed acute to early subacute infarcts in the left midbrain, thalami, left frontal lobe.  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 Hypercoagulable panel is pending. TEE showed aneurysmal interatrial septum with small PFO unlikely to be clinically  significant. LDL 117, goal less than 70, continue Lipitor 40 mg daily. Hemoglobin A1c 4.9, goal of less than 7.0.  Hyperlipidemia LDL 117 with goal less than 70 Continue Lipitor 40 mg daily.   Resolved mild metabolic acidosis normal anion gap:  Isolated hyperbilirubinemia, unclear etiology T bilirubin 1.7> 1.8     DVT prophylaxis: lovenox subcu daily  Family Communication: Updated her son at bedside.   Status is: Inpatient status.  Patient will require least 2 midnights for further evaluation and treatment of present condition.       Code Status: Full code  Disposition Plan: Likely will discharge to CIR if insurance approves and bed is available, possibly on 12/02/2020, medically cleared.   Consultants: Neurology, signed off.  Procedures: 2D echo, TEE  Antimicrobials: None  Objective: Vitals:   12/01/20 0051 12/01/20 0458 12/01/20 0817 12/01/20 1151  BP: 125/76 119/69 (!) 134/58 105/79  Pulse: 62 82 86 86  Resp: 14 14 16 16   Temp: 98.4 F (36.9 C) 98.3 F (36.8 C) 98.3 F (36.8 C) 98 F (36.7 C)  TempSrc: Oral Oral Oral Oral  SpO2: 100% 100% 100% 99%  Weight:      Height:       No intake or output data in the 24 hours ending 12/01/20 1627  Filed Weights   11/28/20 1127 11/30/20 1219  Weight: 67 kg 67 kg    Exam:  General: 50 y.o. year-old female well-developed well-nourished in no acute distress.  She is alert and oriented x3.  Cardiovascular: Regular rate and rhythm no rubs or gallops.  No JVD  or thyromegaly noted.   Respiratory: Clear to auscultation with no wheezes or rales.   Abdomen: Soft nontender normal bowel sounds present.   Musculoskeletal: No lower extremity edema bilaterally.   Skin: No ulcerative lesions noted.   Psychiatry: Mood is appropriate for condition and setting.   Data Reviewed: CBC: Recent Labs  Lab 11/28/20 1406 11/29/20 0346 12/01/20 0531  WBC 9.5 10.6* 10.3  NEUTROABS 7.4  --   --   HGB 13.6 13.7 12.4  HCT  40.7 43.1 37.7  MCV 94.0 97.5 93.8  PLT 234 236 235   Basic Metabolic Panel: Recent Labs  Lab 11/28/20 1406 11/29/20 0346 12/01/20 0531  NA 137 137 138  K 3.8 3.8 3.8  CL 112* 111 110  CO2 17* 18* 22  GLUCOSE 91 91 94  BUN 9 13 13   CREATININE 0.95 0.97 0.84  CALCIUM 8.9 9.4 8.7*  MG  --   --  2.0   GFR: Estimated Creatinine Clearance: 74.4 mL/min (by C-G formula based on SCr of 0.84 mg/dL). Liver Function Tests: Recent Labs  Lab 11/29/20 0346 12/01/20 0531  AST 21 15  ALT 27 20  ALKPHOS 74 70  BILITOT 1.7* 1.8*  PROT 7.2 6.7  ALBUMIN 3.5 3.1*   No results for input(s): LIPASE, AMYLASE in the last 168 hours. No results for input(s): AMMONIA in the last 168 hours. Coagulation Profile: No results for input(s): INR, PROTIME in the last 168 hours. Cardiac Enzymes: No results for input(s): CKTOTAL, CKMB, CKMBINDEX, TROPONINI in the last 168 hours. BNP (last 3 results) No results for input(s): PROBNP in the last 8760 hours. HbA1C: Recent Labs    11/29/20 0346  HGBA1C 4.9   CBG: No results for input(s): GLUCAP in the last 168 hours. Lipid Profile: Recent Labs    11/29/20 0346  CHOL 177  HDL 35*  LDLCALC 117*  TRIG 123  CHOLHDL 5.1   Thyroid Function Tests: Recent Labs    11/29/20 0346  TSH 3.244   Anemia Panel: No results for input(s): VITAMINB12, FOLATE, FERRITIN, TIBC, IRON, RETICCTPCT in the last 72 hours. Urine analysis: No results found for: COLORURINE, APPEARANCEUR, LABSPEC, PHURINE, GLUCOSEU, HGBUR, BILIRUBINUR, KETONESUR, PROTEINUR, UROBILINOGEN, NITRITE, LEUKOCYTESUR Sepsis Labs: @LABRCNTIP (procalcitonin:4,lacticidven:4)  ) Recent Results (from the past 240 hour(s))  Resp Panel by RT-PCR (Flu A&B, Covid) Nasopharyngeal Swab     Status: None   Collection Time: 11/28/20  9:23 PM   Specimen: Nasopharyngeal Swab; Nasopharyngeal(NP) swabs in vial transport medium  Result Value Ref Range Status   SARS Coronavirus 2 by RT PCR NEGATIVE NEGATIVE  Final    Comment: (NOTE) SARS-CoV-2 target nucleic acids are NOT DETECTED.  The SARS-CoV-2 RNA is generally detectable in upper respiratory specimens during the acute phase of infection. The lowest concentration of SARS-CoV-2 viral copies this assay can detect is 138 copies/mL. A negative result does not preclude SARS-Cov-2 infection and should not be used as the sole basis for treatment or other patient management decisions. A negative result may occur with  improper specimen collection/handling, submission of specimen other than nasopharyngeal swab, presence of viral mutation(s) within the areas targeted by this assay, and inadequate number of viral copies(<138 copies/mL). A negative result must be combined with clinical observations, patient history, and epidemiological information. The expected result is Negative.  Fact Sheet for Patients:   Fact Sheet for Healthcare Providers:  11/30/20  This test is no t yet approved or cleared by the [employer] and  has been authorized  for detection and/or diagnosis of SARS-CoV-2 by FDA under an Emergency Use Authorization (EUA). This EUA will remain  in effect (meaning this test can be used) for the duration of the COVID-19 declaration under Section 564(b)(1) of the Act, 21 U.S.C.section 360bbb-3(b)(1), unless the authorization is terminated  or revoked sooner.       Influenza A by PCR NEGATIVE NEGATIVE Final   Influenza B by PCR NEGATIVE NEGATIVE Final    Comment: (NOTE) The Xpert Xpress SARS-CoV-2/FLU/RSV plus assay is intended as an aid in the diagnosis of influenza from Nasopharyngeal swab specimens and should not be used as a sole basis for treatment. Nasal washings and aspirates are unacceptable for Xpert Xpress SARS-CoV-2/FLU/RSV testing.  Fact Sheet for Patients: [employer]  Fact Sheet for Healthcare  Providers: [employer]  This test is not yet approved or cleared by the [place] FDA and has been authorized for detection and/or diagnosis of SARS-CoV-2 by FDA under an Emergency Use Authorization (EUA). This EUA will remain in effect (meaning this test can be used) for the duration of the COVID-19 declaration under Section 564(b)(1) of the Act, 21 U.S.C. section 360bbb-3(b)(1), unless the authorization is terminated or revoked.  Performed at [facility], [address], [place], [place] [address]       Studies: No results found.  Scheduled Meds:  aspirin EC  81 mg Oral Daily   atorvastatin  40 mg Oral Daily   clopidogrel  75 mg Oral Daily   enoxaparin (LOVENOX) injection  40 mg Subcutaneous Q24H    Continuous Infusions:   LOS: 2 days     Darlin Drop, MD [facility] Pager [phone]  If 7PM-7AM, please contact night-coverage [email or web address] Password [facility] 12/01/2020, 4:27 PM

## 2020-12-02 DIAGNOSIS — I639 Cerebral infarction, unspecified: Secondary | ICD-10-CM | POA: Diagnosis not present

## 2020-12-02 LAB — BILIRUBIN, DIRECT: Bilirubin, Direct: 0.1 mg/dL (ref 0.0–0.2)

## 2020-12-02 NOTE — Progress Notes (Addendum)
IP rehab admissions - I met with patient and we spoke to her son by phone on speaker.  I explained inpatient rehab.  Son would like me to try to get insurance auth for inpatient rehab stay.  [employer] insurance carrier is currently closed for the holiday.  I will open the case with [employer] on Monday and update all once I hear back from insurance [employer].  Patient has her mother at home who can provide 24/7 supervision.  Dtr and son are in college.  Call for questions.  [phone]

## 2020-12-02 NOTE — Progress Notes (Signed)
Inpatient Rehab Admissions Coordinator:   Per therapy recommendations  patient was screened for CIR candidacy by Megan Salon, MS, CCC-SLP. At this time, Pt. Appears to be a a potential candidate for CIR. I will place   order for rehab consult per protocol for full assessment. Note that pt. Is relatively high level and CIR beds are limited at this time, and I am not able to open insurance until Monday, so it is possible Pt. Will progress to being able to go home while awaiting a CIR bed. If MD wishes to d/c sooner, may want to consider other rehab venues.  Please contact me any with questions.  Megan Salon, MS, CCC-SLP Rehab Admissions Coordinator  [phone] (celll) [phone] (office)

## 2020-12-02 NOTE — Progress Notes (Signed)
PROGRESS NOTE  Alexa Hatfield OBS:[id number] DOB: 14-Feb-1970 DOA: 11/28/2020 PCP: Pcp, No  HPI Alexa Hatfield is an 50 y.o. female no significant past medical history comes into the hospital for evaluation of diplopia and fatigue that started 3 days prior to admission CT scan of the head in the [place] showed no acute findings according to the patient.  And symptoms were attributed to birth control pills.  In the ED she was found to have a bicarbonate of 17 MRI of the brain was concerning for acute early subacute infarct of the left midbrain thalamus and left frontal lobe as well as 3 mm focus cortical enhancement of the left temporal lobe without edema, MRI of the orbit appeared unremarkable.  Post TEE on 11/30/2020 which shows aneurysmal interatrial septum with possibly a small PFO.  Per neurology and cardiology unlikely to be source of stroke.    SUBJECTIVE: No acute events overnight. She has been ambulating without issue. No chest pain, palpitations, dyspnea, lightheadedness.  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 She started taking oral contraceptive pills that she bought in the [place]. MRA of the brain without contrast showed acute to early subacute infarcts in the left midbrain, thalami, left frontal lobe.  suspected occlusion of the proximal left PICA with normal flow related enhancement of its distal segment. - Hypercoagulable panel is pending. - TEE showed aneurysmal interatrial septum with small PFO unlikely to be clinically significant. - aspirin 81 mg +  Plavix 75 mg daily x21 days then aspirin 81 mg daily alone indefinitely.   - C/W statin - consider Ziopatch on discharge to eval for AF given cryptogenic CVA  Cognitive impairment, suspected Related to stroke, but there may be language  barrier affecting ability to perform well on cognitive tests - SLP following; will likely need AIR  Hyperlipidemia LDL 117 with goal less than 70 - Continue Lipitor 40 mg daily.  Isolated hyperbilirubinemia, unclear etiology No RUQ pain or other abnl LFTs, no other symptoms. - T bilirubin 1.7> 1.8    DVT prophylaxis: lovenox subcu daily  Status is: Inpatient status.  Patient will require least 2 midnights for further evaluation and treatment of present condition.  Code Status: Full code  Disposition Plan: Likely will discharge to Acute inpatient rehab  Consultants: Neurology, signed off.  Procedures: 2D echo, TEE  Antimicrobials: None  Objective: Vitals:   12/01/20 1948 12/02/20 0006 12/02/20 0423 12/02/20 0843  BP: 110/71 112/69 110/74 120/76  Pulse: 83 76 77 76  Resp: 15 14 16 16   Temp: 98.4 F (36.9 C) 98.2 F (36.8 C) 98.2 F (36.8 C) 98.3 F (36.8 C)  TempSrc: Oral Oral Oral Oral  SpO2: 98% 99% 98% 98%  Weight:      Height:        Intake/Output Summary (Last 24 hours) at 12/02/2020 1626 Last data filed at 12/02/2020 0815 Gross per 24 hour  Intake 240 ml  Output --  Net 240 ml    Filed Weights   11/28/20 1127 11/30/20 1219  Weight: 67 kg 67 kg    Exam:  General: 50 y.o. year-old female well-developed well-nourished in no acute distress HEENT: PERRL Cardiovascular: Regular rate and rhythm no rubs or gallops.  No JVD or thyromegaly noted.   Respiratory: Clear to auscultation with no wheezes or rales.   Abdomen: Soft, nontender, nondistended normal  bowel sounds present.   Musculoskeletal: No lower extremity edema bilaterally.   Neuro: equal strength bilateral UE; alert, oriented Skin: No ulcerative lesions or rash noted.   Psychiatry: Mood is appropriate for condition and setting.   Data Reviewed: CBC: Recent Labs  Lab 11/28/20 1406 11/29/20 0346 12/01/20 0531  WBC 9.5 10.6* 10.3  NEUTROABS 7.4  --   --   HGB 13.6 13.7 12.4  HCT 40.7  43.1 37.7  MCV 94.0 97.5 93.8  PLT 234 236 235    Basic Metabolic Panel: Recent Labs  Lab 11/28/20 1406 11/29/20 0346 12/01/20 0531  NA 137 137 138  K 3.8 3.8 3.8  CL 112* 111 110  CO2 17* 18* 22  GLUCOSE 91 91 94  BUN 9 13 13   CREATININE 0.95 0.97 0.84  CALCIUM 8.9 9.4 8.7*  MG  --   --  2.0    GFR: Estimated Creatinine Clearance: 74.4 mL/min (by C-G formula based on SCr of 0.84 mg/dL). Liver Function Tests: Recent Labs  Lab 11/29/20 0346 12/01/20 0531  AST 21 15  ALT 27 20  ALKPHOS 74 70  BILITOT 1.7* 1.8*  PROT 7.2 6.7  ALBUMIN 3.5 3.1*    No results for input(s): LIPASE, AMYLASE in the last 168 hours. No results for input(s): AMMONIA in the last 168 hours. Coagulation Profile: No results for input(s): INR, PROTIME in the last 168 hours. Cardiac Enzymes: No results for input(s): CKTOTAL, CKMB, CKMBINDEX, TROPONINI in the last 168 hours. BNP (last 3 results) No results for input(s): PROBNP in the last 8760 hours. HbA1C: No results for input(s): HGBA1C in the last 72 hours.  CBG: No results for input(s): GLUCAP in the last 168 hours. Lipid Profile: No results for input(s): CHOL, HDL, LDLCALC, TRIG, CHOLHDL, LDLDIRECT in the last 72 hours.  Thyroid Function Tests: No results for input(s): TSH, T4TOTAL, FREET4, T3FREE, THYROIDAB in the last 72 hours.  Anemia Panel: No results for input(s): VITAMINB12, FOLATE, FERRITIN, TIBC, IRON, RETICCTPCT in the last 72 hours. Urine analysis: No results found for: COLORURINE, APPEARANCEUR, LABSPEC, PHURINE, GLUCOSEU, HGBUR, BILIRUBINUR, KETONESUR, PROTEINUR, UROBILINOGEN, NITRITE, LEUKOCYTESUR   ) Recent Results (from the past 240 hour(s))  Resp Panel by RT-PCR (Flu A&B, Covid) Nasopharyngeal Swab     Status: None   Collection Time: 11/28/20  9:23 PM   Specimen: Nasopharyngeal Swab; Nasopharyngeal(NP) swabs in vial transport medium  Result Value Ref Range Status   SARS Coronavirus 2 by RT PCR NEGATIVE NEGATIVE  Final    Comment: (NOTE) SARS-CoV-2 target nucleic acids are NOT DETECTED.  The SARS-CoV-2 RNA is generally detectable in upper respiratory specimens during the acute phase of infection. The lowest concentration of SARS-CoV-2 viral copies this assay can detect is 138 copies/mL. A negative result does not preclude SARS-Cov-2 infection and should not be used as the sole basis for treatment or other patient management decisions. A negative result may occur with  improper specimen collection/handling, submission of specimen other than nasopharyngeal swab, presence of viral mutation(s) within the areas targeted by this assay, and inadequate number of viral copies(<138 copies/mL). A negative result must be combined with clinical observations, patient history, and epidemiological information. The expected result is Negative.  Fact Sheet for Patients:  11/30/20  Fact Sheet for Healthcare Providers:  BloggerCourse.com  This test is no t yet approved or cleared by the [place] FDA and  has been authorized for detection and/or diagnosis of SARS-CoV-2 by FDA under an Emergency Use Authorization (EUA). This EUA  will remain  in effect (meaning this test can be used) for the duration of the COVID-19 declaration under Section 564(b)(1) of the Act, 21 U.S.C.section 360bbb-3(b)(1), unless the authorization is terminated  or revoked sooner.       Influenza A by PCR NEGATIVE NEGATIVE Final   Influenza B by PCR NEGATIVE NEGATIVE Final    Comment: (NOTE) The Xpert Xpress SARS-CoV-2/FLU/RSV plus assay is intended as an aid in the diagnosis of influenza from Nasopharyngeal swab specimens and should not be used as a sole basis for treatment. Nasal washings and aspirates are unacceptable for Xpert Xpress SARS-CoV-2/FLU/RSV testing.  Fact Sheet for Patients: BloggerCourse.com  Fact Sheet for Healthcare  Providers: SeriousBroker.it  This test is not yet approved or cleared by the [place] FDA and has been authorized for detection and/or diagnosis of SARS-CoV-2 by FDA under an Emergency Use Authorization (EUA). This EUA will remain in effect (meaning this test can be used) for the duration of the COVID-19 declaration under Section 564(b)(1) of the Act, 21 U.S.C. section 360bbb-3(b)(1), unless the authorization is terminated or revoked.  Performed at [facility], [address], [place], [place] [address]        Studies: No results found.  Scheduled Meds:  aspirin EC  81 mg Oral Daily   atorvastatin  40 mg Oral Daily   clopidogrel  75 mg Oral Daily   enoxaparin (LOVENOX) injection  40 mg Subcutaneous Q24H    Continuous Infusions:   LOS: 3 days     Rachael Fee, MD [facility]  12/02/2020, 4:26 PM

## 2020-12-02 NOTE — PMR Pre-admission (Shared)
PMR Admission Coordinator Pre-Admission Assessment  Patient: Alexa Hatfield is an 50 y.o., female MRN: [id number] DOB: [birth year] Height: '5\' 3"'  (160 cm) Weight: 67 kg  Insurance Information HMO: ***    PPO: ***     PCP: ***     IPA: ***     80/20: ***     OTHER: *** PRIMARY: Generic [employer]      Policy#: [id number]      Subscriber: patient CM Name: ***      Phone#: ***     Fax#: *** Pre-Cert#: ***      Employer: [employer] Benefits:  Phone #: [phone]     Name: *** Irene Shipper. Date: ***     Deduct: ***      Out of Pocket Max: ***      Life Max: *** CIR: ***      SNF: *** Outpatient: ***     Co-Pay: *** Home Health: ***      Co-Pay: *** DME: ***     Co-Pay: *** Providers: in network  SECONDARY:        Policy#:       Phone#:    Development worker, community:       Phone#:   [employer] for patients in Inpatient Rehabilitation Facilities with attached Privacy Act [place] Records was provided and verbally reviewed with: N/A  Emergency Contact Information Contact Information     Name Relation Home Work [place] Son   [phone]   Geanie Kenning Daughter [phone]         Current Medical History  Patient Admitting Diagnosis: L CVA  History of Present Illness: A pleasant 50 y.o. female who denies any significant past medical history now presents emergency department for evaluation of diplopia and fatigue.  Patient complains of 3 days of fatigue and diplopia that she states was evaluated in the [place] with head CT that she was told was normal, and the symptoms were attributed to her birth control pills.  Symptoms have persisted despite stopping the oral contraceptive that the patient seems to be beginning to improve some tonight.  She denies any headache, focal numbness or weakness, difficulty swallowing, fevers, chills, chest pain, or palpitations.  She has never experienced this previously.  Upon arrival to the ED,  patient is found to be afebrile, saturating well on room air, and with stable blood pressure.  EKG features normal sinus rhythm.  Chemistry panel with bicarbonate 17 and CBC is unremarkable.  MRI brain is concerning for acute early subacute infarctions in the left midbrain, thalamus, and left frontal lobe, as well as a 3 mm focus of cortical enhancement in the left temporal lobe without edema or diffusion abnormality which could be infarction or vascular.  Neurology was consulted by the ED physician and hospitalists asked to admit.  PT/OT/SLP evaluations completed with recommendations for acute inpatient rehab admission.  Complete NIHSS TOTAL: 1  [facility] been reviewed by the rehabilitation admission coordinator and physician.  Past Medical History  Past Medical History:  Diagnosis Date   Complication of anesthesia    "difficulty breathing and required O2 during c-section"   Medical history non-contributory     Has the patient had major surgery during 100 days prior to admission? No  Family History   family history includes Heart attack in her father.  Current Medications  Current Facility-Administered Medications:    acetaminophen (TYLENOL) tablet 650 mg, 650 mg, Oral, Q4H PRN **  OR** acetaminophen (TYLENOL) 160 MG/5ML solution 650 mg, 650 mg, Per Tube, Q4H PRN **OR** acetaminophen (TYLENOL) suppository 650 mg, 650 mg, Rectal, Q4H PRN, Opyd, Ilene Qua, MD   aspirin EC tablet 81 mg, 81 mg, Oral, Daily, Charlynne Cousins, MD, 81 mg at 12/02/20 0917   atorvastatin (LIPITOR) tablet 40 mg, 40 mg, Oral, Daily, Bailey-Modzik, Delila A, NP, 40 mg at 12/02/20 4097   clopidogrel (PLAVIX) tablet 75 mg, 75 mg, Oral, Daily, Aileen Fass, Tammi Klippel, MD, 75 mg at 12/02/20 0917   enoxaparin (LOVENOX) injection 40 mg, 40 mg, Subcutaneous, Q24H, Opyd, Ilene Qua, MD, 40 mg at 12/01/20 2129   senna-docusate (Senokot-S) tablet 1 tablet, 1 tablet, Oral, QHS PRN, Opyd,  Ilene Qua, MD  Patients Current Diet:  Diet Order             Diet Heart Room service appropriate? Yes; Fluid consistency: Thin  Diet effective now                   Precautions / Restrictions Precautions Precautions: Fall Precaution Comments: double vision; parital occlusion glasses Restrictions Weight Bearing Restrictions: No   Has the patient had 2 or more falls or a fall with injury in the past year? No  Prior Activity Level Community (5-7x/wk): Went out daily.  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  Prior Functional Level Self Care: Did the patient need help bathing, dressing, using the toilet or eating? Independent  Indoor Mobility: Did the patient need assistance with walking from room to room (with or without device)? Independent  Stairs: Did the patient need assistance with internal or external stairs (with or without device)? Independent  Functional Cognition: Did the patient need help planning regular tasks such as shopping or remembering to take medications? Independent  Patient Information Are you of Hispanic, Latino/a,or Spanish origin?: E. Yes, another Hispanic, Latino, or Spanish origin (States hispanic) What is your race?: A. White Do you need or want an interpreter to communicate with a doctor or health care staff?: 1. Yes  Patient's Response To:  Health Literacy and Transportation Is the patient able to respond to health literacy and transportation needs?: Yes Health Literacy - How often do you need to have someone help you when you read instructions, pamphlets, or other written material from your doctor or pharmacy?: Often In the past 12 months, has lack of transportation kept you from medical appointments or from getting medications?: No In the past 12 months, has lack of transportation kept you from meetings, work, or from getting things needed for daily living?: No  Development worker, international aid / [place] Devices/Equipment:  None Home Equipment: None  Prior Device Use: Indicate devices/aids used by the patient prior to current illness, exacerbation or injury? None of the above  Current Functional Level Cognition  Arousal/Alertness: Awake/alert Overall Cognitive Status: Impaired/Different from baseline Current Attention Level: Selective Orientation Level: Oriented to person, Oriented to place, Oriented to time, Oriented to situation Following Commands: Follows multi-step commands inconsistently, Follows one step commands inconsistently Safety/Judgement: Decreased awareness of safety, Decreased awareness of deficits General Comments: Pt repeatedly forgetting or deciding not to following simple commands, indicating memory and safety awareness deficits. Pt knew her room number but unable to find initially, requiring max cues to problem solve and look at room numbers on wall and determine where to go. Attention: Focused, Sustained, Selective, Alternating Focused Attention: Appears intact Sustained Attention: Appears intact Selective Attention: Impaired Selective Attention Impairment: Verbal complex, Functional complex Alternating Attention:  Impaired Alternating Attention Impairment: Verbal complex, Functional complex Memory: Appears intact Awareness: Impaired Awareness Impairment: Emergent impairment Problem Solving: Impaired Problem Solving Impairment: Functional basic, Verbal basic Executive Function: Reasoning, Self Monitoring Reasoning: Impaired Reasoning Impairment: Verbal basic, Functional basic Self Monitoring: Impaired Self Monitoring Impairment: Verbal basic, Functional basic Safety/Judgment: Impaired    Extremity Assessment (includes Sensation/Coordination)  Upper Extremity Assessment: RUE deficits/detail RUE Deficits / Details: strength and ROM overall WFL; at times appears to spontaneously use non-dominant LUE rather than R - ? mild inattention  Lower Extremity Assessment: Overall WFL for  tasks assessed    ADLs  Overall ADL's : Needs assistance/impaired Eating/Feeding: Modified independent Grooming: Set up Upper Body Bathing: Set up, Sitting Lower Body Bathing: Min guard, Sit to/from stand Upper Body Dressing : Set up, Sitting Lower Body Dressing: Min guard, Sit to/from stand Toilet Transfer: Ambulation, Minimal assistance Toilet Transfer Details (indicate cue type and reason): unsteady Toileting- Clothing Manipulation and Hygiene: Min guard Functional mobility during ADLs: Moderate assistance General ADL Comments: Ambulated to toilet - bumping into counter on R; walked to door adn required mod A to prevent fall    Mobility  Overal bed mobility: Modified Independent General bed mobility comments: Extra time but able to come to sit L EOB without assistance.    Transfers  Overall transfer level: Needs assistance Transfers: Sit to/from Stand Sit to Stand: Min guard General transfer comment: Pt remains impulsive, standing before cued to do so, minG for safety, unsteadiness noted but no LOB.    Ambulation / Gait / Stairs / Wheelchair Mobility  Ambulation/Gait Ambulation/Gait assistance: Min guard, Mod assist, Min assist Gait Distance (Feet): 250 Feet Assistive device: 1 person hand held assist, None, Straight cane Gait Pattern/deviations: Step-through pattern, Decreased stride length, Staggering right, Drifts right/left, Scissoring General Gait Details: Pt with lateral staggering, primarily to R, and intermittent L foot scissioring, likely to try to regain balance. Pt with poor reactional strategies, needing up to min-modA to maintain balance, at least x5 bouts of staggering/LOB. Pt unable to change head positions and ambulate simultaneously, partly due to lack of command following, pt requires increased concentration to maintain balance. Trial use of cane but pt not able to use properly and sated she felt more unsteady with it. Gait velocity: Decreased Gait velocity  interpretation: <1.31 ft/sec, indicative of household ambulator Stairs: Yes Stairs assistance: Min assist Stair Management: Forwards, No rails, Step to pattern Number of Stairs: 2 General stair comments: Ascends and descends with unsteadiness but no LOB this session, minA for cues and safety    Posture / Balance Balance Overall balance assessment: Needs assistance Sitting-balance support: No upper extremity supported Sitting balance-Leahy Scale: Good Standing balance support: No upper extremity supported Standing balance-Leahy Scale: Fair Standing balance comment: LOB occurs standing statically or with dynamic mobility, needing up to min-modA to regain balance often. Standardized Balance Assessment Standardized Balance Assessment : Dynamic Gait Index Dynamic Gait Index Level Surface: Mild Impairment Gait with Horizontal Head Turns: Moderate Impairment Gait with Vertical Head Turns: Moderate Impairment Gait and Pivot Turn: Mild Impairment Step Over Obstacle: Mild Impairment Step Around Obstacles: Mild Impairment Steps: Mild Impairment    Special needs/care consideration None   Previous Home Environment (from acute therapy documentation) Living Arrangements: Parent, Children  Lives With:  (mother, son and daughter in their 108s) Available Help at Discharge: Available PRN/intermittently Type of Home: [place]: One level Home Access: Stairs to enter Entrance Stairs-Rails: Right Entrance Stairs-Number of Steps: 5 Bathroom Shower/Tub: Gaffer  Bathroom Toilet: Programmer, systems: Yes How Accessible: Accessible via walker Home Care Services: No  Discharge Living Setting Plans for Discharge Living Setting: Lives with (comment), House (Lives with mother, son and daughter.) Type of Home at Discharge: House Discharge Home Layout: One level Discharge Home Access: Stairs to enter Entrance Stairs-Rails: Right Entrance Stairs-Number of Steps: 3 Discharge  Bathroom Shower/Tub: Tub/shower unit, Curtain Discharge Bathroom Toilet: Standard Discharge Bathroom Accessibility: Yes How Accessible: Accessible via walker Does the patient have any problems obtaining your medications?: No  Social/Family/Support Systems Patient Roles: Parent, Other (Comment) (Has her mother, son and daughter.) Contact Information: [place] - son - [phone] Anticipated Caregiver: patient's mother Ability/Limitations of Caregiver: Mom is 79 yrs old, in good shape per son and can provide supervision to light assistance. Caregiver Availability: 24/7 Discharge Plan Discussed with Primary Caregiver: Yes Is Caregiver In Agreement with Plan?: Yes Does Caregiver/Family have Issues with Lodging/Transportation while Pt is in Rehab?: No  Goals Patient/Family Goal for Rehab: PT/OT mod I to supervision goals Expected length of stay: 7-10 days Cultural Considerations: Hispanic, speaks Spanish Pt/Family Agrees to Admission and willing to participate: Yes Program Orientation Provided & Reviewed with Pt/Caregiver Including Roles  & Responsibilities: Yes  Decrease burden of Care through IP rehab admission: N/A  Possible need for SNF placement upon discharge: Not anticipated  Patient Condition: I have reviewed medical records from [facility], spoken with CM, and patient and son. I met with patient at the bedside and discussed via phone for inpatient rehabilitation assessment.  Patient will benefit from ongoing PT, OT, and SLP, can actively participate in 3 hours of therapy a day 5 days of the week, and can make measurable gains during the admission.  Patient will also benefit from the coordinated team approach during an Inpatient Acute Rehabilitation admission.  The patient will receive intensive therapy as well as Rehabilitation physician, nursing, social worker, and care management interventions.  Due to bladder management, bowel management, safety, skin/wound care, disease  management, medication administration, pain management, and patient education the patient requires 24 hour a day rehabilitation nursing.  The patient is currently minguard to mod assist with mobility and basic ADLs.  Discharge setting and therapy post discharge at home with home health is anticipated.  Patient has agreed to participate in the Acute Inpatient Rehabilitation Program and will admit {Time; today/tomorrow:10263}.  Preadmission Screen Completed By:  Retta Diones, 12/02/2020 12:49 PM ______________________________________________________________________   Discussed status with Dr. Marland Kitchen on *** at *** and received approval for admission today.  Admission Coordinator:  Retta Diones, RN, time Marland KitchenSudie Grumbling ***   Assessment/Plan: Diagnosis: Does the need for close, 24 hr/day Medical supervision in concert with the patient's rehab needs make it unreasonable for this patient to be served in a less intensive setting? {yes_no_potentially:3041433} Co-Morbidities requiring supervision/potential complications: *** Due to {due UJ:8119147}, does the patient require 24 hr/day rehab nursing? {yes_no_potentially:3041433} Does the patient require coordinated care of a physician, rehab nurse, PT, OT, and SLP to address physical and functional deficits in the context of the above medical diagnosis(es)? {yes_no_potentially:3041433} Addressing deficits in the following areas: {deficits:3041436} Can the patient actively participate in an intensive therapy program of at least 3 hrs of therapy 5 days a week? {yes_no_potentially:3041433} The potential for patient to make measurable gains while on inpatient rehab is {potential:3041437} Anticipated functional outcomes upon discharge from inpatient rehab: {functional outcomes:304600100} PT, {functional outcomes:304600100} OT, {functional outcomes:304600100} SLP Estimated rehab length of stay to reach the above functional goals is: ***  Anticipated discharge  destination: {anticipated dc setting:21604} 10. Overall Rehab/Functional Prognosis: {potential:3041437}   MD Signature: ***

## 2020-12-02 NOTE — Evaluation (Signed)
Speech Language Pathology Evaluation Patient Details Name: Jackolyn Geron MRN: [id number] DOB: 1970/01/24 Today's Date: 12/02/2020 Time: 2025-4270 SLP Time Calculation (min) (ACUTE ONLY): 27 min  Problem List:  Patient Active Problem List   Diagnosis Date Noted   CVA (cerebral vascular accident) (HCC) 11/29/2020   Ischemic stroke (HCC) 11/28/2020   Past Medical History:  Past Medical History:  Diagnosis Date   Complication of anesthesia    "difficulty breathing and required O2 during c-section"   Medical history non-contributory    Past Surgical History:  Past Surgical History:  Procedure Laterality Date   AMPUTATION Left 02/25/2018   Procedure: AMPUTATION OF FINGER;  Surgeon: Knute Neu, MD;  Location: [facility] OR;  Service: Plastics;  Laterality: Left;   CESAREAN SECTION     x2   HPI:  Nianna Igo is a 50 y.o. female with no significant past medical history except for complicated Liviu who presents with 3 days history of fatigue and feeling really tired along with double vision.     She reports that she was in [place] when the symptoms initially started.  She went to the ED and had a CT head without contrast which was negative for any acute intracranial abnormalities.  Her symptoms were attribute it to her oral contraceptive pill which was stopped.  She had no significant improvement in her symptoms despite stopping her oral contraceptive pill.  When she came here, she went to an urgent care and was sent to the ED for further evaluation and work-up.  He had MRI brain with and without contrast which demonstrated acute to early subacute infarct in the left midbrain, bilateral thalami and left frontal lobe.   Assessment / Plan / Recommendation Clinical Impression  As PT and OT have observed, pt demosntrates cognitive deficits in areas of emergent awareness, reasoning and dual task processing. Pt scored a 13 out of 30 possible points on the [facility] Mental Status  exam, indicating significant cognitive impairing in a  variety of areas. She does verbalize intellectual awareness of impairments and states worry over return home given that she is "in charge of everything." However when faced with a fairly simple verbal reasoning or fuctional problem solving task pt has no awareness of odd responses or fairly overt errors. She is able to complete very basic mobility, but adding any dual task complexity or alternating attention causes breakdown of safety and fluency. She is highly stimulable for cueing and wants to improve. Would respond well to intesive short term rehabilitition to increase safety in a home setting with intermittent assist and supervision.    SLP Assessment  SLP Recommendation/Assessment: Patient needs continued Speech Lanaguage Pathology Services    Recommendations for follow up therapy are one component of a multi-disciplinary discharge planning process, led by the attending physician.  Recommendations may be updated based on patient status, additional functional criteria and insurance authorization.    Follow Up Recommendations  Acute inpatient rehab (3hours/day)    Assistance Recommended at Discharge  Intermittent Supervision/Assistance  Functional Status Assessment Patient has had a recent decline in their functional status and demonstrates the ability to make significant improvements in function in a reasonable and predictable amount of time.  Frequency and Duration min 2x/week  2 weeks      SLP Evaluation Cognition  Overall Cognitive Status: Impaired/Different from baseline Arousal/Alertness: Awake/alert Orientation Level: Oriented to person;Oriented to place;Oriented to time;Oriented to situation Attention: Focused;Sustained;Selective;Alternating Focused Attention: Appears intact Sustained Attention: Appears intact Selective Attention: Impaired Selective  Attention Impairment: Verbal complex;Functional complex Alternating  Attention: Impaired Alternating Attention Impairment: Verbal complex;Functional complex Memory: Appears intact Awareness: Impaired Awareness Impairment: Emergent impairment Problem Solving: Impaired Problem Solving Impairment: Functional basic;Verbal basic Executive Function: Reasoning;Self Monitoring Reasoning: Impaired Reasoning Impairment: Verbal basic;Functional basic Self Monitoring: Impaired Self Monitoring Impairment: Verbal basic;Functional basic Safety/Judgment: Impaired       Comprehension  Auditory Comprehension Overall Auditory Comprehension: Appears within functional limits for tasks assessed Reading Comprehension Reading Status: Within funtional limits    Expression Expression Primary Mode of Expression: Verbal Verbal Expression Overall Verbal Expression: Appears within functional limits for tasks assessed Written Expression Dominant Hand: Right Written Expression: Within Functional Limits   Oral / Motor  Oral Motor/Sensory Function Overall Oral Motor/Sensory Function: Within functional limits Motor Speech Overall Motor Speech: Appears within functional limits for tasks assessed   GO                    Gary Bultman, Riley Nearing 12/02/2020, 10:10 AM

## 2020-12-02 NOTE — Progress Notes (Addendum)
Physical Therapy Treatment Patient Details Name: Alexa Hatfield MRN: [id number] DOB: [birth year] Today's Date: 12/02/2020   History of Present Illness 50 yo admitted 11/28/20 with double vision and fatigue. 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 S/p TEE 11/23. PMH: complicated Liviu; finger amputation    PT Comments    Pt received and supine and agreeable to therapy session. Utilized in-person interpreter, Ashby Dawes to communicate with patient during session. Pt demonstrates impairments in memory, problem-solving, and safety awareness requiring heavy cues throughout session. Pt also demonstrates standing balance deficits with LOB/staggering during household distance gait tasks requiring up to modA to regain her balance. Pt with poor insight into deficits as she states she does not feel unsteady when ambulating although she remains a high fall risk per Dynamic Gait Index assessment tasks (see below). Continue to recommend AIR upon DC to address remaining deficits in balance, mobility, and function. Pt continues to benefit from PT services to progress toward functional mobility goals.      Recommendations for follow up therapy are one component of a multi-disciplinary discharge planning process, led by the attending physician.  Recommendations may be updated based on patient status, additional functional criteria and insurance authorization.  Follow Up Recommendations  Acute inpatient rehab (3hours/day)     Assistance Recommended at Discharge Frequent or constant Supervision/Assistance  Equipment Recommendations  Rolling walker (2 wheels)    Recommendations for Other Services Rehab consult     Precautions / Restrictions Precautions Precautions: Fall Precaution Comments: double vision; partial occlusion glasses Restrictions Weight Bearing Restrictions: No     Mobility  Bed  Mobility Overal bed mobility: Modified Independent   General bed mobility comments: Extra time but able to come to sit L EOB without assistance.    Transfers Overall transfer level: Needs assistance   Transfers: Sit to/from Stand Sit to Stand: Min guard   General transfer comment: Pt remains impulsive, standing before cued to do so, minG for safety, unsteadiness noted but no LOB.    Ambulation/Gait Ambulation/Gait assistance: Min guard;Mod assist;Min assist Gait Distance (Feet): 250 Feet Assistive device: 1 person hand held assist;None;Straight cane Gait Pattern/deviations: Step-through pattern;Decreased stride length;Staggering right;Drifts right/left;Scissoring Gait velocity: Decreased     General Gait Details: Pt with lateral staggering, primarily to R, and intermittent L foot scissioring, likely to try to regain balance. Pt with poor reactional strategies, needing up to min-modA to maintain balance, at least x5 bouts of staggering/LOB. Pt unable to rotate head and ambulate simultaneously and difficulty with any dual tasking when prompted; pt requires increased concentration to maintain balance. Trial use of cane but pt not able to use properly and stated she felt more unsteady with it.   Stairs   Stairs assistance: Min assist Stair Management: Forwards;No rails;Step to pattern   General stair comments: Ascends and descends with unsteadiness but no LOB this session, minA for cues and safety   Modified Rankin (Stroke Patients Only) Modified Rankin (Stroke Patients Only) Pre-Morbid Rankin Score: No symptoms Modified Rankin: Moderately severe disability     Balance Overall balance assessment: Needs assistance Sitting-balance support: No upper extremity supported Sitting balance-Leahy Scale: Good    Standing balance support: No upper extremity supported Standing balance-Leahy Scale: Fair Standing balance comment: LOB occurs standing statically or with dynamic mobility,  needing up to min-modA to regain balance often.    Standardized Balance Assessment Standardized Balance Assessment : Dynamic  Gait Index   Dynamic Gait Index Level Surface: Mild Impairment Gait with Horizontal Head Turns: Moderate Impairment Gait with Vertical Head Turns: Moderate Impairment Gait and Pivot Turn: Mild Impairment Step Over Obstacle: Mild Impairment Step Around Obstacles: Mild Impairment Steps: Mild Impairment      Cognition Arousal/Alertness: Awake/alert Behavior During Therapy: Flat affect Overall Cognitive Status: Impaired/Different from baseline Area of Impairment: Attention;Safety/judgement;Awareness;Problem solving;Memory;Following commands   Current Attention Level: Selective Memory: Decreased short-term memory;Decreased recall of precautions Following Commands: Follows multi-step commands inconsistently;Follows one step commands inconsistently Safety/Judgement: Decreased awareness of safety;Decreased awareness of deficits Awareness: Emergent Problem Solving: Slow processing;Difficulty sequencing;Requires verbal cues General Comments: Pt repeatedly forgetting or deciding not to following simple commands, indicating memory and safety awareness deficits. Pt knew her room number but unable to find initially, requiring max cues to problem solve and look at room numbers on wall and determine where to go. Functional Status Assessment: Patient has had a recent decline in their functional status and demonstrates the ability to make significant improvements in function in a reasonable and predictable amount of time.         General Comments General comments (skin integrity, edema, etc.): Pt stated she felt good and was not tired at end of session and HR WFL.      Pertinent Vitals/Pain Pain Assessment: No/denies pain    Home Living   Available Help at Discharge: Available PRN/intermittently Type of Home: House        PT Goals (current goals can now be found in  the care plan section) Acute Rehab PT Goals PT Goal Formulation: With patient Time For Goal Achievement: 12/13/20 Progress towards PT goals: Progressing toward goals    Frequency    Min 4X/week      PT Plan Discharge plan needs to be updated       AM-PAC PT "6 Clicks" Mobility   Outcome Measure  Help needed turning from your back to your side while in a flat bed without using bedrails?: None Help needed moving from lying on your back to sitting on the side of a flat bed without using bedrails?: None Help needed moving to and from a bed to a chair (including a wheelchair)?: A Little Help needed standing up from a chair using your arms (e.g., wheelchair or bedside chair)?: A Little Help needed to walk in hospital room?: A Lot Help needed climbing 3-5 steps with a railing? : A Little 6 Click Score: 19    End of Session Equipment Utilized During Treatment: Gait belt Activity Tolerance: Patient tolerated treatment well Patient left: in chair;with call bell/phone within reach;with chair alarm set;with family/visitor present (Family entering room upon therapist exit) Nurse Communication: Mobility status PT Visit Diagnosis: Unsteadiness on feet (R26.81);Difficulty in walking, not elsewhere classified (R26.2);Other abnormalities of gait and mobility (R26.89);Other symptoms and signs involving the nervous system (R29.898)     Time: 4270-6237 PT Time Calculation (min) (ACUTE ONLY): 20 min  Charges:  $Gait Training: 8-22 mins                     Harland German, Student PTA CI: Carly P., PTA  Carly M Poff 12/02/2020, 1:20 PM

## 2020-12-03 ENCOUNTER — Other Ambulatory Visit: Payer: Self-pay | Admitting: Cardiology

## 2020-12-03 DIAGNOSIS — I639 Cerebral infarction, unspecified: Secondary | ICD-10-CM

## 2020-12-03 NOTE — Progress Notes (Signed)
Physical Therapy Treatment Patient Details Name: Alexa Hatfield MRN: [id number] DOB: 10/21/1970 Today's Date: 12/03/2020   History of Present Illness 50 yo admitted 11/28/20 with double vision and fatigue. 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 S/p TEE 11/23. PMH: complicated Liviu; finger amputation    PT Comments    Patient received in bed, pleasant and cooperative; declines use of RW for hallway ambulation. Did much better than yesterday- steady and stable when focusing only on gait, however does become more unsteady and off balance when distracted and dual tasking. She does want to continue to try for rehab in AIR setting prior to return home. Left in bed with all needs met, bed alarm active. Will continue to follow while she remains in house.    Recommendations for follow up therapy are one component of a multi-disciplinary discharge planning process, led by the attending physician.  Recommendations may be updated based on patient status, additional functional criteria and insurance authorization.  Follow Up Recommendations  Acute inpatient rehab (3hours/day)     Assistance Recommended at Discharge Frequent or constant Supervision/Assistance  Equipment Recommendations  Rolling walker (2 wheels)    Recommendations for Other Services       Precautions / Restrictions Precautions Precautions: Fall Precaution Comments: double vision; parital occlusion glasses Restrictions Weight Bearing Restrictions: No     Mobility  Bed Mobility Overal bed mobility: Independent             General bed mobility comments: from flat bed    Transfers Overall transfer level: Needs assistance   Transfers: Sit to/from Stand Sit to Stand: Supervision           General transfer comment: S for safety, no unsteadiness or LOB today; refused RW for transfers     Ambulation/Gait Ambulation/Gait assistance: Min guard Gait Distance (Feet): 300 Feet Assistive device: None Gait Pattern/deviations: Step-through pattern;Drifts right/left;Narrow base of support Gait velocity: Decreased     General Gait Details: did much better today- did not have any lateral staggering or scissoring although BOS was a bit more narrow than I'd prefer; did struggle with U-turns in hallway but was able to maintain balance without extra assist from PT. Tried head turns with gait, she was very hesitant to do this dynamically and needed extra focus for any dual tasking activities in hallway. Stable when focusing on only one thing, quickly becomes unsteady when dual tasking or distracted.   Stairs             Wheelchair Mobility    Modified Rankin (Stroke Patients Only)       Balance Overall balance assessment: Needs assistance Sitting-balance support: No upper extremity supported;Feet supported Sitting balance-Leahy Scale: Normal     Standing balance support: No upper extremity supported Standing balance-Leahy Scale: Fair                              Cognition Arousal/Alertness: Awake/alert Behavior During Therapy: Flat affect Overall Cognitive Status: Impaired/Different from baseline Area of Impairment: Safety/judgement;Awareness;Problem solving                   Current Attention Level: Selective Memory: Decreased short-term memory;Decreased recall of precautions Following Commands: Follows one step commands consistently;Follows multi-step commands inconsistently Safety/Judgement: Decreased awareness of safety;Decreased awareness of deficits Awareness: Emergent Problem Solving: Slow processing;Difficulty sequencing;Requires verbal cues   General Comments: did much better with command following today, able to find her room without being cued by therapist; still needs cues for functional problem solving and safety as well as for  general navigation in hallway        Exercises      General Comments        Pertinent Vitals/Pain Pain Assessment: No/denies pain    Home Living                          Prior Function            PT Goals (current goals can now be found in the care plan section) Acute Rehab PT Goals Patient Stated Goal: to brush her teeth PT Goal Formulation: With patient Time For Goal Achievement: 12/13/20 Potential to Achieve Goals: Good Progress towards PT goals: Progressing toward goals    Frequency    Min 4X/week      PT Plan Current plan remains appropriate    Co-evaluation              AM-PAC PT "6 Clicks" Mobility   Outcome Measure  Help needed turning from your back to your side while in a flat bed without using bedrails?: None Help needed moving from lying on your back to sitting on the side of a flat bed without using bedrails?: None Help needed moving to and from a bed to a chair (including a wheelchair)?: A Little Help needed standing up from a chair using your arms (e.g., wheelchair or bedside chair)?: A Little Help needed to walk in hospital room?: A Little Help needed climbing 3-5 steps with a railing? : A Little 6 Click Score: 20    End of Session Equipment Utilized During Treatment: Gait belt Activity Tolerance: Patient tolerated treatment well Patient left: in bed;with call bell/phone within reach;with bed alarm set (refused up to recliner) Nurse Communication: Mobility status PT Visit Diagnosis: Unsteadiness on feet (R26.81);Difficulty in walking, not elsewhere classified (R26.2);Other abnormalities of gait and mobility (R26.89);Other symptoms and signs involving the nervous system (R29.898)     Time: 1420-1436 PT Time Calculation (min) (ACUTE ONLY): 16 min  Charges:  $Gait Training: 8-22 mins                    Windell Norfolk, DPT, PN2   Supplemental Physical Therapist Nathalie    Pager [phone] Acute Rehab Office  [phone]

## 2020-12-03 NOTE — Progress Notes (Signed)
PROGRESS NOTE  Alexa Hatfield KYH:[id number] DOB: [birth year] DOA: 11/28/2020 PCP: Pcp, No  HPI/Recap of past 24 hours: Alexa Hatfield is an 51 y.o. female no significant past medical history comes into the hospital for evaluation of diplopia and fatigue that started 3 days prior to admission CT scan of the head in the [place] showed no acute findings according to the patient.  And symptoms were attributed to birth control pills.  In the ED she was found to have a bicarbonate of 17 MRI of the brain was concerning for acute early subacute infarct of the left midbrain thalamus and left frontal lobe as well as 3 mm focus cortical enhancement of the left temporal lobe without edema, MRI of the orbit appeared unremarkable.  Post TEE on 11/30/2020 which shows aneurysmal interatrial septum with possibly a small PFO.  Per neurology and cardiology unlikely to be source of stroke.    Neurology, recommends aspirin 81 mg daily and Plavix 75 mg daily x21 days then aspirin 81 mg daily alone indefinitely.  Seen by PT OT with recommendation for CIR.  Stroke is found to be cryptogenic.  Cardiology contacted for possible loop recorder placement versus Zio patch for DC planning.  12/03/2020: Seen at her bedside.  She has no new complaints.  Updated her son via phone.  Assessment/Plan: Principal Problem:   Ischemic stroke [facility]) Active Problems:   CVA (cerebral vascular accident) (HCC)  Acute multiple Ischemic stroke (HCC): Concern about embolic in nature. She started taking oral contraceptive pills on 11/11/2020 that she bought in the [place]. HgbA1c 4.9, fasting lipid panel HDL less than 40 LDL greater than 100. MRA of the brain without contrast showed acute to early subacute infarcts in the left midbrain, thalami, left frontal lobe.  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 Hypercoagulable panel is pending. TEE  showed aneurysmal interatrial septum with small PFO unlikely to be clinically significant. LDL 117, goal less than 70, continue Lipitor 40 mg daily. Hemoglobin A1c 4.9, goal of less than 7.0. Cardiology consulted for possible Zio patch placement for DC planning.  Hyperlipidemia LDL 117 with goal less than 70 Continue Lipitor 40 mg daily.   Resolved mild metabolic acidosis normal anion gap:  Isolated hyperbilirubinemia, unclear etiology T bilirubin 1.7> 1.8     DVT prophylaxis: lovenox subcu daily  Family Communication: Updated her son at bedside.   Status is: Inpatient status.  Patient will require least 2 midnights for further evaluation and treatment of present condition.       Code Status: Full code  Disposition Plan: Likely will discharge to CIR if insurance approves and bed is available, possibly on 12/02/2020, medically cleared.   Consultants: Neurology, signed off.  Procedures: 2D echo, TEE  Antimicrobials: None  Objective: Vitals:   12/02/20 2355 12/03/20 0327 12/03/20 0731 12/03/20 1126  BP: 102/73 124/83 127/79 116/70  Pulse: 86 72 64 70  Resp:  18 17 17   Temp:  98.4 F (36.9 C) 98.4 F (36.9 C) 98.1 F (36.7 C)  TempSrc:  Oral Oral Oral  SpO2: 99% 100% 99% 98%  Weight:      Height:        Intake/Output Summary (Last 24 hours) at 12/03/2020 1319 Last data filed at 12/03/2020 0900 Gross per 24 hour  Intake 120 ml  Output --  Net 120 ml    Filed Weights   11/28/20 1127 11/30/20 1219  Weight: 67 kg  67 kg    Exam: No significant changes from prior exam.  General: 50 y.o. year-old female well-developed well-nourished in no acute distress.  She is alert and oriented x3.  Cardiovascular: Regular rate and rhythm no rubs or gallops.  No JVD or thyromegaly noted.   Respiratory: Clear to auscultation with no wheezes or rales.   Abdomen: Soft nontender normal bowel sounds present.   Musculoskeletal: No lower extremity edema bilaterally.    Skin: No ulcerative lesions noted.   Psychiatry: Mood is appropriate for condition and setting.   Data Reviewed: CBC: Recent Labs  Lab 11/28/20 1406 11/29/20 0346 12/01/20 0531  WBC 9.5 10.6* 10.3  NEUTROABS 7.4  --   --   HGB 13.6 13.7 12.4  HCT 40.7 43.1 37.7  MCV 94.0 97.5 93.8  PLT 234 236 235   Basic Metabolic Panel: Recent Labs  Lab 11/28/20 1406 11/29/20 0346 12/01/20 0531  NA 137 137 138  K 3.8 3.8 3.8  CL 112* 111 110  CO2 17* 18* 22  GLUCOSE 91 91 94  BUN 9 13 13   CREATININE 0.95 0.97 0.84  CALCIUM 8.9 9.4 8.7*  MG  --   --  2.0   GFR: Estimated Creatinine Clearance: 74.4 mL/min (by C-G formula based on SCr of 0.84 mg/dL). Liver Function Tests: Recent Labs  Lab 11/29/20 0346 12/01/20 0531  AST 21 15  ALT 27 20  ALKPHOS 74 70  BILITOT 1.7* 1.8*  PROT 7.2 6.7  ALBUMIN 3.5 3.1*   No results for input(s): LIPASE, AMYLASE in the last 168 hours. No results for input(s): AMMONIA in the last 168 hours. Coagulation Profile: No results for input(s): INR, PROTIME in the last 168 hours. Cardiac Enzymes: No results for input(s): CKTOTAL, CKMB, CKMBINDEX, TROPONINI in the last 168 hours. BNP (last 3 results) No results for input(s): PROBNP in the last 8760 hours. HbA1C: No results for input(s): HGBA1C in the last 72 hours.  CBG: No results for input(s): GLUCAP in the last 168 hours. Lipid Profile: No results for input(s): CHOL, HDL, LDLCALC, TRIG, CHOLHDL, LDLDIRECT in the last 72 hours.  Thyroid Function Tests: No results for input(s): TSH, T4TOTAL, FREET4, T3FREE, THYROIDAB in the last 72 hours.  Anemia Panel: No results for input(s): VITAMINB12, FOLATE, FERRITIN, TIBC, IRON, RETICCTPCT in the last 72 hours. Urine analysis: No results found for: COLORURINE, APPEARANCEUR, LABSPEC, PHURINE, GLUCOSEU, HGBUR, BILIRUBINUR, KETONESUR, PROTEINUR, UROBILINOGEN, NITRITE, LEUKOCYTESUR Sepsis Labs: @LABRCNTIP (procalcitonin:4,lacticidven:4)  ) Recent  Results (from the past 240 hour(s))  Resp Panel by RT-PCR (Flu A&B, Covid) Nasopharyngeal Swab     Status: None   Collection Time: 11/28/20  9:23 PM   Specimen: Nasopharyngeal Swab; Nasopharyngeal(NP) swabs in vial transport medium  Result Value Ref Range Status   SARS Coronavirus 2 by RT PCR NEGATIVE NEGATIVE Final    Comment: (NOTE) SARS-CoV-2 target nucleic acids are NOT DETECTED.  The SARS-CoV-2 RNA is generally detectable in upper respiratory specimens during the acute phase of infection. The lowest concentration of SARS-CoV-2 viral copies this assay can detect is 138 copies/mL. A negative result does not preclude SARS-Cov-2 infection and should not be used as the sole basis for treatment or other patient management decisions. A negative result may occur with  improper specimen collection/handling, submission of specimen other than nasopharyngeal swab, presence of viral mutation(s) within the areas targeted by this assay, and inadequate number of viral copies(<138 copies/mL). A negative result must be combined with clinical observations, patient history, and epidemiological information. The expected  result is Negative.  Fact Sheet for Patients:  BloggerCourse.com  Fact Sheet for Healthcare Providers:  SeriousBroker.it  This test is no t yet approved or cleared by the [place] FDA and  has been authorized for detection and/or diagnosis of SARS-CoV-2 by FDA under an Emergency Use Authorization (EUA). This EUA will remain  in effect (meaning this test can be used) for the duration of the COVID-19 declaration under Section 564(b)(1) of the Act, 21 U.S.C.section 360bbb-3(b)(1), unless the authorization is terminated  or revoked sooner.       Influenza A by PCR NEGATIVE NEGATIVE Final   Influenza B by PCR NEGATIVE NEGATIVE Final    Comment: (NOTE) The Xpert Xpress SARS-CoV-2/FLU/RSV plus assay is intended as an aid in the  diagnosis of influenza from Nasopharyngeal swab specimens and should not be used as a sole basis for treatment. Nasal washings and aspirates are unacceptable for Xpert Xpress SARS-CoV-2/FLU/RSV testing.  Fact Sheet for Patients: BloggerCourse.com  Fact Sheet for Healthcare Providers: SeriousBroker.it  This test is not yet approved or cleared by the [place] FDA and has been authorized for detection and/or diagnosis of SARS-CoV-2 by FDA under an Emergency Use Authorization (EUA). This EUA will remain in effect (meaning this test can be used) for the duration of the COVID-19 declaration under Section 564(b)(1) of the Act, 21 U.S.C. section 360bbb-3(b)(1), unless the authorization is terminated or revoked.  Performed at [facility], [address] [place], [place] [address]       Studies: No results found.  Scheduled Meds:  aspirin EC  81 mg Oral Daily   atorvastatin  40 mg Oral Daily   clopidogrel  75 mg Oral Daily   enoxaparin (LOVENOX) injection  40 mg Subcutaneous Q24H    Continuous Infusions:   LOS: 4 days     Darlin Drop, MD [facility] Pager [phone]  If 7PM-7AM, please contact night-coverage [email or web address] Password [email or web address] 12/03/2020, 1:19 PM

## 2020-12-03 NOTE — Plan of Care (Signed)
  Problem: Education: Goal: Knowledge of disease or condition will improve Outcome: Progressing   Problem: Coping: Goal: Will verbalize positive feelings about self Outcome: Progressing   Problem: Self-Care: Goal: Ability to participate in self-care as condition permits will improve Outcome: Progressing   Problem: Ischemic Stroke/TIA Tissue Perfusion: Goal: Complications of ischemic stroke/TIA will be minimized Outcome: Progressing   

## 2020-12-04 ENCOUNTER — Encounter (HOSPITAL_COMMUNITY): Payer: Self-pay | Admitting: Cardiology

## 2020-12-04 DIAGNOSIS — I639 Cerebral infarction, unspecified: Secondary | ICD-10-CM | POA: Diagnosis not present

## 2020-12-04 NOTE — Plan of Care (Signed)
  Problem: Education: Goal: Knowledge of disease or condition will improve Outcome: Progressing Goal: Knowledge of secondary prevention will improve (SELECT ALL) Outcome: Progressing Goal: Knowledge of patient specific risk factors will improve (INDIVIDUALIZE FOR PATIENT) Outcome: Progressing Goal: Individualized Educational Video(s) Outcome: Progressing   Problem: Coping: Goal: Will verbalize positive feelings about self Outcome: Progressing Goal: Will identify appropriate support needs Outcome: Progressing   Problem: Self-Care: Goal: Ability to participate in self-care as condition permits will improve Outcome: Progressing Goal: Verbalization of feelings and concerns over difficulty with self-care will improve Outcome: Progressing Goal: Ability to communicate needs accurately will improve Outcome: Progressing   Problem: Intracerebral Hemorrhage Tissue Perfusion: Goal: Complications of Intracerebral Hemorrhage will be minimized Outcome: Progressing   Problem: Ischemic Stroke/TIA Tissue Perfusion: Goal: Complications of ischemic stroke/TIA will be minimized Outcome: Progressing   Problem: Intracerebral Hemorrhage Tissue Perfusion: Goal: Complications of Intracerebral Hemorrhage will be minimized Outcome: Progressing

## 2020-12-04 NOTE — Plan of Care (Signed)
  Problem: Self-Care: Goal: Ability to participate in self-care as condition permits will improve Outcome: Progressing   

## 2020-12-04 NOTE — Progress Notes (Signed)
PROGRESS NOTE  Alexa Hatfield EXB:[id number] DOB: 1970/09/30 DOA: 11/28/2020 PCP: Pcp, No  HPI/Recap of past 24 hours: Alexa Hatfield is an 50 y.o. female no significant past medical history comes into the hospital for evaluation of diplopia and fatigue that started 3 days prior to admission.  CT scan of the head in the [place] showed no acute findings according to the patient.  Symptoms were attributed to birth control pills which were started on 11/11/2020.  Work-up concerning for acute early subacute infarct of the left midbrain thalamus and left frontal lobe as well as 3 mm focus cortical enhancement of the left temporal lobe without edema, MRI of the orbit was unremarkable.  Post TEE on 11/30/2020 showed aneurysmal interatrial septum with possible small PFO.  Per neurology and cardiology unlikely to be source of stroke.    Neurology, recommends aspirin 81 mg daily and Plavix 75 mg daily x21 days then aspirin 81 mg daily alone indefinitely.  Seen by PT OT with recommendation for CIR.  Stroke is cryptogenic.  Discussed with cardiology, will arrange for Zio patch x30 days.  12/04/2020: Seen at her bedside.  She has no new complaints.  There were no acute events overnight  Assessment/Plan: Principal Problem:   Ischemic stroke [facility]) Active Problems:   CVA (cerebral vascular accident) (HCC)  Acute multiple Ischemic stroke (HCC): Concern about embolic in nature. She started taking oral contraceptive pills she bought in the [place] on 11/11/2020. HgbA1c 4.9 LDL 117, goal LDL less than 70. MRA of the brain without contrast showed acute to early subacute infarcts in the left midbrain, thalami, left frontal lobe.   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 Hypercoagulable panel. TEE showed aneurysmal interatrial septum with small PFO unlikely to be clinically significant. Continue aspirin 81 mg daily,  Plavix 75 mg daily, Lipitor 40 mg daily. Discussed with cardiology, plan for Zio patch x30 days after discharge.  Hyperlipidemia LDL 117 with goal less than 70 Continue Lipitor 40 mg daily.   Resolved mild metabolic acidosis normal anion gap:  Isolated hyperbilirubinemia, unclear etiology T bilirubin 1.7> 1.8 Repeat level in the morning     DVT prophylaxis: lovenox subcu daily  Family Communication: No family members at bedside.   Status is: Inpatient status.  Patient will require at least 2 midnights for further evaluation and treatment of present condition.       Code Status: Full code  Disposition Plan: Likely will discharge to CIR if insurance approves and bed is available, possibly on 12/05/2020, she is currently medically cleared for discharge.   Consultants: Neurology, signed off.  Procedures: 2D echo, TEE  Antimicrobials: None  Objective: Vitals:   12/04/20 0000 12/04/20 0410 12/04/20 0821 12/04/20 1213  BP: 140/88 118/75 122/84 111/74  Pulse: 64 73 75 92  Resp: 18 18  18   Temp:  97.8 F (36.6 C) 98.3 F (36.8 C) 98 F (36.7 C)  TempSrc:  Oral Oral   SpO2: 100% 98% 99% 98%  Weight:      Height:       No intake or output data in the 24 hours ending 12/04/20 1406   Filed Weights   11/28/20 1127 11/30/20 1219  Weight: 67 kg 67 kg    Exam:  General: 50 y.o. year-old female well-developed well-nourished in no acute distress.  She is alert and oriented x3.   Cardiovascular: Regular rate and rhythm no rubs or gallops.  Respiratory: Clear to auscultation with no wheezes or rales.   Abdomen: Soft nontender normal bowel sounds present.   Musculoskeletal: No lower extremity edema bilaterally. Skin: No ulcerative lesions noted. Psychiatry: Mood is appropriate for condition and setting.   Data Reviewed: CBC: Recent Labs  Lab 11/28/20 1406 11/29/20 0346 12/01/20 0531  WBC 9.5 10.6* 10.3  NEUTROABS 7.4  --   --   HGB 13.6 13.7 12.4  HCT  40.7 43.1 37.7  MCV 94.0 97.5 93.8  PLT 234 236 235   Basic Metabolic Panel: Recent Labs  Lab 11/28/20 1406 11/29/20 0346 12/01/20 0531  NA 137 137 138  K 3.8 3.8 3.8  CL 112* 111 110  CO2 17* 18* 22  GLUCOSE 91 91 94  BUN 9 13 13   CREATININE 0.95 0.97 0.84  CALCIUM 8.9 9.4 8.7*  MG  --   --  2.0   GFR: Estimated Creatinine Clearance: 74.4 mL/min (by C-G formula based on SCr of 0.84 mg/dL). Liver Function Tests: Recent Labs  Lab 11/29/20 0346 12/01/20 0531  AST 21 15  ALT 27 20  ALKPHOS 74 70  BILITOT 1.7* 1.8*  PROT 7.2 6.7  ALBUMIN 3.5 3.1*   No results for input(s): LIPASE, AMYLASE in the last 168 hours. No results for input(s): AMMONIA in the last 168 hours. Coagulation Profile: No results for input(s): INR, PROTIME in the last 168 hours. Cardiac Enzymes: No results for input(s): CKTOTAL, CKMB, CKMBINDEX, TROPONINI in the last 168 hours. BNP (last 3 results) No results for input(s): PROBNP in the last 8760 hours. HbA1C: No results for input(s): HGBA1C in the last 72 hours.  CBG: No results for input(s): GLUCAP in the last 168 hours. Lipid Profile: No results for input(s): CHOL, HDL, LDLCALC, TRIG, CHOLHDL, LDLDIRECT in the last 72 hours.  Thyroid Function Tests: No results for input(s): TSH, T4TOTAL, FREET4, T3FREE, THYROIDAB in the last 72 hours.  Anemia Panel: No results for input(s): VITAMINB12, FOLATE, FERRITIN, TIBC, IRON, RETICCTPCT in the last 72 hours. Urine analysis: No results found for: COLORURINE, APPEARANCEUR, LABSPEC, PHURINE, GLUCOSEU, HGBUR, BILIRUBINUR, KETONESUR, PROTEINUR, UROBILINOGEN, NITRITE, LEUKOCYTESUR Sepsis Labs: @LABRCNTIP (procalcitonin:4,lacticidven:4)  ) Recent Results (from the past 240 hour(s))  Resp Panel by RT-PCR (Flu A&B, Covid) Nasopharyngeal Swab     Status: None   Collection Time: 11/28/20  9:23 PM   Specimen: Nasopharyngeal Swab; Nasopharyngeal(NP) swabs in vial transport medium  Result Value Ref Range Status    SARS Coronavirus 2 by RT PCR NEGATIVE NEGATIVE Final    Comment: (NOTE) SARS-CoV-2 target nucleic acids are NOT DETECTED.  The SARS-CoV-2 RNA is generally detectable in upper respiratory specimens during the acute phase of infection. The lowest concentration of SARS-CoV-2 viral copies this assay can detect is 138 copies/mL. A negative result does not preclude SARS-Cov-2 infection and should not be used as the sole basis for treatment or other patient management decisions. A negative result may occur with  improper specimen collection/handling, submission of specimen other than nasopharyngeal swab, presence of viral mutation(s) within the areas targeted by this assay, and inadequate number of viral copies(<138 copies/mL). A negative result must be combined with clinical observations, patient history, and epidemiological information. The expected result is Negative.  Fact Sheet for Patients:   Fact Sheet for Healthcare Providers:  11/30/20  This test is no t yet approved or cleared by the BloggerCourse.com FDA and  has been authorized for detection and/or diagnosis of SARS-CoV-2 by FDA under an Emergency Use Authorization (EUA). This  EUA will remain  in effect (meaning this test can be used) for the duration of the COVID-19 declaration under Section 564(b)(1) of the Act, 21 U.S.C.section 360bbb-3(b)(1), unless the authorization is terminated  or revoked sooner.       Influenza A by PCR NEGATIVE NEGATIVE Final   Influenza B by PCR NEGATIVE NEGATIVE Final    Comment: (NOTE) The Xpert Xpress SARS-CoV-2/FLU/RSV plus assay is intended as an aid in the diagnosis of influenza from Nasopharyngeal swab specimens and should not be used as a sole basis for treatment. Nasal washings and aspirates are unacceptable for Xpert Xpress SARS-CoV-2/FLU/RSV testing.  Fact Sheet for  Patients: BloggerCourse.com  Fact Sheet for Healthcare Providers: SeriousBroker.it  This test is not yet approved or cleared by the [place] FDA and has been authorized for detection and/or diagnosis of SARS-CoV-2 by FDA under an Emergency Use Authorization (EUA). This EUA will remain in effect (meaning this test can be used) for the duration of the COVID-19 declaration under Section 564(b)(1) of the Act, 21 U.S.C. section 360bbb-3(b)(1), unless the authorization is terminated or revoked.  Performed at [facility], [address] [place], [place] [address]       Studies: No results found.  Scheduled Meds:  aspirin EC  81 mg Oral Daily   atorvastatin  40 mg Oral Daily   clopidogrel  75 mg Oral Daily   enoxaparin (LOVENOX) injection  40 mg Subcutaneous Q24H    Continuous Infusions:   LOS: 5 days     Darlin Drop, MD [facility] Pager [phone]  If 7PM-7AM, please contact night-coverage [email or web address] Password [email or web address] 12/04/2020, 2:06 PM

## 2020-12-05 DIAGNOSIS — I639 Cerebral infarction, unspecified: Secondary | ICD-10-CM | POA: Diagnosis not present

## 2020-12-05 LAB — FACTOR 5 LEIDEN

## 2020-12-05 LAB — HEPATIC FUNCTION PANEL
ALT: 20 U/L (ref 0–44)
AST: 19 U/L (ref 15–41)
Albumin: 3.4 g/dL — ABNORMAL LOW (ref 3.5–5.0)
Alkaline Phosphatase: 66 U/L (ref 38–126)
Bilirubin, Direct: 0.1 mg/dL (ref 0.0–0.2)
Indirect Bilirubin: 0.8 mg/dL (ref 0.3–0.9)
Total Bilirubin: 0.9 mg/dL (ref 0.3–1.2)
Total Protein: 7 g/dL (ref 6.5–8.1)

## 2020-12-05 LAB — CREATININE, SERUM
Creatinine, Ser: 0.72 mg/dL (ref 0.44–1.00)
GFR, Estimated: >60 mL/min (ref >60)

## 2020-12-05 LAB — ANCA TITERS
Atypical P-ANCA titer: <1:20 {titer}
C-ANCA: <1:20 {titer}
P-ANCA: <1:20 {titer}

## 2020-12-05 MED ORDER — ASPIRIN 81 MG PO TBEC: 81.0000 mg | DELAYED_RELEASE_TABLET | Freq: Every day | ORAL | 0 refills | Status: AC

## 2020-12-05 MED ORDER — CLOPIDOGREL BISULFATE 75 MG PO TABS
75.0000 mg | ORAL_TABLET | Freq: Every day | ORAL | 0 refills | Status: AC
Start: 2020-12-06 — End: 2020-12-21

## 2020-12-05 MED ORDER — ATORVASTATIN CALCIUM 40 MG PO TABS: 40.0000 mg | ORAL_TABLET | Freq: Every day | ORAL | 0 refills | Status: AC

## 2020-12-05 NOTE — TOC Transition Note (Signed)
Transition of Care [facility]) - CM/SW Discharge Note   Patient Details  Name: Alexa Hatfield MRN: [id number] Date of Birth: 1970/06/02  Transition of [facility]) CM/SW Contact:  Kermit Balo, RN Phone Number: 12/05/2020, 12:15 PM   Clinical Narrative:    Patient is discharging home with outpatient therapy. Information for outpatient therapy is on the AVS.  3 in 1 for home to be delivered to the room per [employer].  Pt has supervision at home and transportation to home.    Final next level of care: OP Rehab Barriers to Discharge: No Barriers Identified   Patient Goals and CMS Choice   CMS Medicare.gov Compare Post Acute Care list provided to:: Patient Represenative (must comment) Choice offered to / list presented to : Adult Children  Discharge Placement                       Discharge Plan and Services   Discharge Planning Services: CM Consult Post Acute Care Choice: Durable Medical Equipment          DME Arranged: 3-N-1 DME Agency: [employer] Date DME Agency Contacted: 12/05/20   Representative spoke with at DME Agency: Jasmin            Social Determinants of Health (SDOH) Interventions     Readmission Risk Interventions No flowsheet data found.

## 2020-12-05 NOTE — Progress Notes (Signed)
Inpatient Rehabilitation Admissions Coordinator   I met at bedside during therapy with patient, PT and OT as well as with Graciella interpreting. Patient has progressed to level appropriate for outpatient neuro rehab. Her Mom lives with her and her children commute to college from home daily. I have updated TOC and we will sign off at this time.  Danne Baxter, RN, MSN Rehab Admissions Coordinator [phone] 12/05/2020 12:27 PM

## 2020-12-05 NOTE — Progress Notes (Signed)
Physical Therapy Treatment Patient Details Name: Alexa Hatfield MRN: [id number] DOB: [birth year] Today's Date: 12/05/2020   History of Present Illness 50 yo admitted 11/28/20 with double vision and fatigue. 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 S/p TEE 11/23. PMH: complicated Liviu; finger amputation    PT Comments    Excellent progress noted with mobility. Supervision transfers, min guard assist ambulation 350' without AD, and min guard assist ascend/descend 5 steps with/without rails. No LOB noted. Pt scored 16/24 on DGI. L visual field deficits present but improving. Discharge recommendations updated to neuro OPPT and no home DME.    Recommendations for follow up therapy are one component of a multi-disciplinary discharge planning process, led by the attending physician.  Recommendations may be updated based on patient status, additional functional criteria and insurance authorization.  Follow Up Recommendations  Outpatient PT (neuro OPPT)     Assistance Recommended at Discharge Intermittent Supervision/Assistance  Equipment Recommendations  None recommended by PT    Recommendations for Other Services       Precautions / Restrictions Precautions Precautions: Fall     Mobility  Bed Mobility Overal bed mobility: Independent                  Transfers Overall transfer level: Needs assistance Equipment used: None Transfers: Sit to/from Stand Sit to Stand: Supervision           General transfer comment: supervision for safety    Ambulation/Gait Ambulation/Gait assistance: Min guard Gait Distance (Feet): 350 Feet Assistive device: None Gait Pattern/deviations: Step-through pattern;Drifts right/left Gait velocity: Decreased Gait velocity interpretation: 1.31 - 2.62 ft/sec, indicative of limited community ambulator   General Gait Details: no staggering  or LOB noted. Pt did drift R/L but able to maneuver around obstacles without physical assist.   Stairs Stairs: Yes Stairs assistance: Min guard Stair Management: No rails;Two rails;Alternating pattern;Forwards Number of Stairs: 5 (x 2) General stair comments: first trial with bilat rails, second trial without rails. No LOB. No physical assist needed.   Wheelchair Mobility    Modified Rankin (Stroke Patients Only) Modified Rankin (Stroke Patients Only) Pre-Morbid Rankin Score: No symptoms Modified Rankin: Moderate disability     Balance Overall balance assessment: Needs assistance Sitting-balance support: No upper extremity supported;Feet supported Sitting balance-Leahy Scale: Normal     Standing balance support: No upper extremity supported;During functional activity Standing balance-Leahy Scale: Good                   Standardized Balance Assessment Standardized Balance Assessment : Dynamic Gait Index   Dynamic Gait Index Level Surface: Normal Change in Gait Speed: Mild Impairment Gait with Horizontal Head Turns: Moderate Impairment Gait with Vertical Head Turns: Moderate Impairment Gait and Pivot Turn: Mild Impairment Step Over Obstacle: Mild Impairment Step Around Obstacles: Mild Impairment Steps: Normal Total Score: 16      Cognition Arousal/Alertness: Awake/alert Behavior During Therapy: Flat affect Overall Cognitive Status: Impaired/Different from baseline Area of Impairment: Problem solving;Awareness;Safety/judgement;Following commands                       Following Commands: Follows one step commands consistently;Follows multi-step commands inconsistently Safety/Judgement: Decreased awareness of safety;Decreased awareness of deficits Awareness: Emergent Problem Solving: Difficulty sequencing;Slow processing;Requires verbal cues          Exercises      General Comments  Pertinent Vitals/Pain Pain Assessment: No/denies  pain    Home Living                          Prior Function            PT Goals (current goals can now be found in the care plan section) Acute Rehab PT Goals Patient Stated Goal: return to baseline Progress towards PT goals: Progressing toward goals    Frequency    Min 4X/week      PT Plan Discharge plan needs to be updated;Equipment recommendations need to be updated    Co-evaluation              AM-PAC PT "6 Clicks" Mobility   Outcome Measure  Help needed turning from your back to your side while in a flat bed without using bedrails?: None Help needed moving from lying on your back to sitting on the side of a flat bed without using bedrails?: None Help needed moving to and from a bed to a chair (including a wheelchair)?: A Little Help needed standing up from a chair using your arms (e.g., wheelchair or bedside chair)?: A Little Help needed to walk in hospital room?: A Little Help needed climbing 3-5 steps with a railing? : A Little 6 Click Score: 20    End of Session Equipment Utilized During Treatment: Gait belt Activity Tolerance: Patient tolerated treatment well Patient left: in chair;with call bell/phone within reach;Other (comment) (with OT in room) Nurse Communication: Mobility status PT Visit Diagnosis: Unsteadiness on feet (R26.81);Difficulty in walking, not elsewhere classified (R26.2);Other abnormalities of gait and mobility (R26.89);Other symptoms and signs involving the nervous system (R29.898)     Time: 1133-1150 PT Time Calculation (min) (ACUTE ONLY): 17 min  Charges:  $Gait Training: 8-22 mins                     Aida Raider, PT  Office # [phone] Pager [phone]    Ilda Foil 12/05/2020, 12:06 PM

## 2020-12-05 NOTE — Discharge Summary (Signed)
Discharge Summary  Alexa Hatfield WUJ:[id number] DOB: November 15, 1970  PCP: Pcp, No  Admit date: 11/28/2020 Discharge date: 12/05/2020  Time spent: 35 minutes.  Recommendations for Outpatient Follow-up:  Follow-up with neurology Follow-up with cardiology Take your medications as prescribed Continue outpatient PT OT.  Discharge Diagnoses:  Active Hospital Problems   Diagnosis Date Noted   Ischemic stroke (HCC) 11/28/2020   CVA (cerebral vascular accident) [facility]) 11/29/2020    Resolved Hospital Problems  No resolved problems to display.    Discharge Condition: Stable  Diet recommendation: Resume previous diet.  Vitals:   12/05/20 0855 12/05/20 1220  BP: 129/90 118/84  Pulse: 71 90  Resp:  18  Temp: 98.3 F (36.8 C) 98.9 F (37.2 C)  SpO2: 99% 97%    History of present illness:  Alexa Hatfield is an 50 y.o. female no significant past medical history comes into the hospital for evaluation of diplopia and fatigue that started 3 days prior to admission.  CT scan of the head in the [place] showed no acute findings according to the patient.  Symptoms were attributed to birth control pills which were started on 11/11/2020.  Work-up concerning for acute early subacute infarct of the left midbrain thalamus and left frontal lobe as well as 3 mm focus cortical enhancement of the left temporal lobe without edema, MRI of the orbit was unremarkable.  Post TEE on 11/30/2020 showed aneurysmal interatrial septum with possible small PFO.  Per neurology and cardiology unlikely to be source of stroke.     Neurology, recommends aspirin 81 mg daily and Plavix 75 mg daily x21 days then aspirin 81 mg daily alone indefinitely.  Seen by PT OT with recommendation for CIR.  Stroke is cryptogenic.  Discussed with cardiology, will arrange for Zio patch x30 days outpatient.   12/05/2020: Seen at bedside.  No acute events overnight.  No new complaints.  Hospital Course:  Principal  Problem:   Ischemic stroke [facility]) Active Problems:   CVA (cerebral vascular accident) (HCC)  Acute multiple Ischemic stroke (HCC): Concern about embolic in nature. She started taking oral contraceptive pills she bought in the [place] on 11/11/2020. HgbA1c 4.9 LDL 117, goal LDL less than 70. MRA of the brain without contrast showed acute to early subacute infarcts in the left midbrain, thalami, left frontal lobe.   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 Hypercoagulable panel. TEE showed aneurysmal interatrial septum with small PFO unlikely to be clinically significant. Continue aspirin 81 mg daily, Plavix 75 mg daily, Lipitor 40 mg daily. Discussed with cardiology, plan for Zio patch x30 days after discharge.   Hyperlipidemia LDL 117 with goal less than 70 Continue Lipitor 40 mg daily.   Resolved mild metabolic acidosis normal anion gap:   Resolved isolated hyperbilirubinemia, unclear etiology T bilirubin 1.7> 1.8> 0.9.     Code Status: Full code      Consultants: Neurology   Procedures: 2D echo, TEE   Antimicrobials: None    Discharge Exam: BP 118/84 (BP Location: Left Arm)   Pulse 90   Temp 98.9 F (37.2 C)   Resp 18   Ht  (1.6 m)   Wt 67 kg   LMP 11/03/2020 (Approximate)   SpO2 97%   BMI 26.17 kg/m  General: 50 y.o. year-old female well developed well nourished in no acute distress.  Alert and oriented x3. Cardiovascular: Regular rate and rhythm with no rubs or gallops.  No thyromegaly or JVD noted.   Respiratory: Clear to auscultation with no wheezes or rales. Good inspiratory effort. Abdomen: Soft nontender nondistended with normal bowel sounds x4 quadrants. Musculoskeletal: No lower extremity edema. 2/4 pulses in all 4 extremities. Skin: No ulcerative lesions noted or rashes, Psychiatry: Mood is appropriate for condition and setting  Discharge Instructions You were cared  for by a hospitalist during your hospital stay. If you have any questions about your discharge medications or the care you received while you were in the hospital after you are discharged, you can call the unit and asked to speak with the hospitalist on call if the hospitalist that took care of you is not available. Once you are discharged, your primary care physician will handle any further medical issues. Please note that NO REFILLS for any discharge medications will be authorized once you are discharged, as it is imperative that you return to your primary care physician (or establish a relationship with a primary care physician if you do not have one) for your aftercare needs so that they can reassess your need for medications and monitor your lab values.  Discharge Instructions     Ambulatory referral to Occupational Therapy   Complete by: As directed    Ambulatory referral to Occupational Therapy   Complete by: As directed    Ambulatory referral to Physical Therapy   Complete by: As directed    Ambulatory referral to Physical Therapy   Complete by: As directed    Ambulatory referral to Speech Therapy   Complete by: As directed       Allergies as of 12/05/2020   Not on File      Medication List     STOP taking these medications    cephALEXin 500 MG capsule Commonly known as: Keflex       TAKE these medications    aspirin 81 MG EC tablet Take 1 tablet (81 mg total) by mouth daily. Swallow whole. Start taking on: December 06, 2020   atorvastatin 40 MG tablet Commonly known as: LIPITOR Take 1 tablet (40 mg total) by mouth daily. Start taking on: December 06, 2020   clopidogrel 75 MG tablet Commonly known as: PLAVIX Take 1 tablet (75 mg total) by mouth daily for 15 days. Start taking on: December 06, 2020               Durable Medical Equipment  (From admission, onward)           Start     Ordered   11/29/20 1620  For home use only DME 3 n 1  Once         11/29/20 1619   11/29/20 1619  For home use only DME Walker rolling  Once       Question Answer Comment  Walker: With 5 Inch Wheels   Patient needs a walker to treat with the following condition Stroke [facility])      11/29/20 1619           Not on File  Follow-up Information     [facility] EMERGENCY DEPARTMENT Today.   Specialty: Emergency Medicine Why: For MRI Contact information: [address] [id number] mc [place] [address] [phone]        [facility] [address]. Schedule an appointment as soon as possible for a visit in 1 week(s).   Specialty: Rehabilitation Contact information: [address] [facility] [id number] mc [place] [address] [phone]  [facility] Follow up.   Specialty: Cardiology Why: office will contact you about a monitor to wear at home for 30 days to see if irregular heart rate may have caused a stroke. Contact information: [address] [place] [address] [phone]        [name], MD Follow up.   Specialties: Cardiology, Radiology Why: office will arrange appt to review heart monitor in 5 weeks or so. Contact information: [address] [place] [address] [phone]         Micki Riley, MD. Call today.   Specialties: Neurology, Radiology Why: Please call for a posthospital follow-up appointment Contact information: [address] [place] [address] [phone]                  The results of significant diagnostics from this hospitalization (including imaging, microbiology, ancillary and laboratory) are listed below for reference.    Significant Diagnostic Studies: MR ANGIO HEAD WO CONTRAST  Result Date: 11/29/2020 CLINICAL DATA:  Neuro deficit, acute, stroke suspected. EXAM: MRA HEAD WITHOUT CONTRAST TECHNIQUE:  Angiographic images of the Circle of Willis were acquired using MRA technique without intravenous contrast. COMPARISON:  MRI of the brain November 28, 2020. FINDINGS: Anterior circulation: Mild luminal irregularity in the proximal petrous and paraclinoid segment may be related to intracranial atherosclerotic disease versus artifact without hemodynamically significant stenosis. Bilateral ACA and MCA vascular trees are preserved. Posterior circulation: Signal loss of the proximal left PICA suggesting occlusion with normal flow related enhancement in its distal segments. The basilar artery has a relatively small caliber noting presence of prominent bilateral posterior communicating arteries. No hemodynamically significant stenosis of the basilar artery or posterior cerebral arteries. Anatomic variants: Prominent bilateral posterior communicating arteries. Other: None. 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 Electronically Signed   By: Baldemar Lenis M.D.   On: 11/29/2020 08:39   MR Brain W and Wo Contrast  Result Date: 11/28/2020 CLINICAL DATA:  eye palsy and visual deficit; visual deficits. Diplopia for 3 days. EXAM: MRI HEAD AND ORBITS WITHOUT AND WITH CONTRAST TECHNIQUE: Multiplanar, multiecho pulse sequences of the brain and surrounding structures were obtained without and with intravenous contrast. Multiplanar, multiecho pulse sequences of the orbits and surrounding structures were obtained including fat saturation techniques, before and after intravenous contrast administration. CONTRAST:  6.55mL GADAVIST GADOBUTROL 1 MMOL/ML IV SOLN COMPARISON:  None. FINDINGS: MRI HEAD FINDINGS Brain: There is a 2 cm acute to early subacute infarct in the left midbrain and thalamus. Additional punctate acute/subacute infarcts are noted in the right thalamus and left middle frontal gyrus. There is a 3 mm focus of cortical enhancement  posteriorly in the left temporal lobe without corresponding T2 or diffusion-weighted signal abnormality (series 15, image 18 and series 16, image 10). No abnormal intracranial enhancement is seen elsewhere. No intracranial hemorrhage, mass, midline shift, or extra-axial fluid collection is identified. There is borderline mild cerebral atrophy. No significant chronic cerebral white matter disease is evident. 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 Vascular: Major intracranial vascular flow voids are preserved. Skull and upper cervical spine: Unremarkable bone marrow signal. Other: None. MRI ORBITS FINDINGS Orbits: The globes appear intact. No optic nerve edema or abnormal enhancement is identified. No orbital mass or inflammation is evident. The extraocular muscles and lacrimal glands are unremarkable.  Visualized sinuses: Minimal bilateral ethmoid air cell mucosal thickening. Clear mastoid air cells. Soft tissues: Unremarkable. IMPRESSION: 1. Acute to early subacute infarcts in the left midbrain, thalami, and left frontal lobe. 2. 3 mm focus of cortical enhancement in the left temporal lobe without edema or diffusion abnormality. This is indeterminate but could reflect a subacute infarct or be vascular. Consider a follow-up MRI in 2-3 months to assess for resolution or persistence/progression. 3. Tiny chronic cerebellar infarcts. 4. Unremarkable appearance of the orbits. Electronically Signed   By: Sebastian Ache M.D.   On: 11/28/2020 20:40   VAS [place] TRANSCRANIAL DOPPLER W BUBBLES  Result Date: 11/30/2020  Transcranial Doppler with Bubble Patient Name:  Alexa Hatfield  Date of Exam:   11/29/2020 Medical Rec #: [id number]           Accession #:    [id number] Date of Birth: [birth year]          Patient Gender: F Patient Age:   56 years Exam Location:  [facility] Procedure:      VAS [place] TRANSCRANIAL DOPPLER W BUBBLES Referring Phys: PRAMOD SETHI  --------------------------------------------------------------------------------  Indications: Stroke. Comparison Study: No prior study Performing Technologist: Gertie Fey MHA, RDMS, RVT, RDCS  Examination Guidelines: A complete evaluation includes B-mode imaging, spectral Doppler, color Doppler, and power Doppler as needed of all accessible portions of each vessel. Bilateral testing is considered an integral part of a complete examination. Limited examinations for reoccurring indications may be performed as noted.  Summary: No HITS at rest or during Valsalva. Negative transcranial Doppler Bubble study with no evidence of right to left intracardiac communication.  A vascular evaluation was performed. The right middle cerebral artery was studied. An IV was inserted into the patient's right forearm. Verbal informed consent was obtained.  *See table(s) above for TCD measurements and observations.  Diagnosing physician: Delia Heady MD Electronically signed by Delia Heady MD on 11/30/2020 at 2:08:02 PM.    Final    ECHOCARDIOGRAM COMPLETE  Result Date: 11/29/2020    ECHOCARDIOGRAM REPORT   Patient Name:   Alexa Hatfield Date of Exam: 11/29/2020 Medical Rec #:  [id number]          Height:       63.0 in Accession #:    [id number]         Weight:       147.7 lb Date of Birth:  [birth year]         BSA:          1.700 m Patient Age:    49 years           BP:           111/79 mmHg Patient Gender: F                  HR:           67 bpm. Exam Location:  Inpatient Procedure: 2D Echo, Cardiac Doppler and Color Doppler Indications:    Stroke  History:        Patient has no prior history of Echocardiogram examinations.  Sonographer:    Roosvelt Maser RDCS Referring Phys: [phone] ABRAHAM FELIZ ORTIZ IMPRESSIONS  1. Left ventricular ejection fraction, by estimation, is 55 to 60%. The left ventricle has normal function. The left ventricle has no regional wall motion abnormalities. Left ventricular diastolic parameters were  normal.  2. Right ventricular systolic function is normal. The right ventricular size is normal. There is normal pulmonary artery  systolic pressure. The estimated right ventricular systolic pressure is 24.7 mmHg.  3. The mitral valve is normal in structure. No evidence of mitral valve regurgitation. No evidence of mitral stenosis.  4. The aortic valve is tricuspid. Aortic valve regurgitation is mild. No aortic stenosis is present.  5. The inferior vena cava is normal in size with greater than 50% respiratory variability, suggesting right atrial pressure of 3 mmHg. FINDINGS  Left Ventricle: Left ventricular ejection fraction, by estimation, is 55 to 60%. The left ventricle has normal function. The left ventricle has no regional wall motion abnormalities. The left ventricular internal cavity size was normal in size. There is  no left ventricular hypertrophy. Left ventricular diastolic parameters were normal. Right Ventricle: The right ventricular size is normal. No increase in right ventricular wall thickness. Right ventricular systolic function is normal. There is normal pulmonary artery systolic pressure. The tricuspid regurgitant velocity is 2.33 m/s, and  with an assumed right atrial pressure of 3 mmHg, the estimated right ventricular systolic pressure is 24.7 mmHg. Left Atrium: Left atrial size was normal in size. Right Atrium: Right atrial size was normal in size. Pericardium: There is no evidence of pericardial effusion. Mitral Valve: The mitral valve is normal in structure. No evidence of mitral valve regurgitation. No evidence of mitral valve stenosis. Tricuspid Valve: The tricuspid valve is normal in structure. Tricuspid valve regurgitation is trivial. Aortic Valve: The aortic valve is tricuspid. Aortic valve regurgitation is mild. Aortic regurgitation PHT measures 600 msec. No aortic stenosis is present. Pulmonic Valve: The pulmonic valve was normal in structure. Pulmonic valve regurgitation is not  visualized. Aorta: The aortic root is normal in size and structure. Venous: The inferior vena cava is normal in size with greater than 50% respiratory variability, suggesting right atrial pressure of 3 mmHg. IAS/Shunts: No atrial level shunt detected by color flow Doppler.  LEFT VENTRICLE PLAX 2D LVIDd:         3.40 cm     Diastology LVIDs:         2.20 cm     LV e' medial:    8.38 cm/s LV PW:         0.90 cm     LV E/e' medial:  9.6 LV IVS:        1.00 cm     LV e' lateral:   12.70 cm/s                            LV E/e' lateral: 6.3  LV Volumes (MOD) LV vol d, MOD A2C: 64.9 ml LV vol d, MOD A4C: 61.3 ml LV vol s, MOD A2C: 17.2 ml LV vol s, MOD A4C: 18.2 ml LV SV MOD A2C:     47.7 ml LV SV MOD A4C:     61.3 ml LV SV MOD BP:      46.4 ml RIGHT VENTRICLE RV Basal diam:  2.70 cm RV S prime:     11.60 cm/s TAPSE (M-mode): 2.2 cm LEFT ATRIUM             Index        RIGHT ATRIUM           Index LA diam:        3.60 cm 2.12 cm/m   RA Area:     10.50 cm LA Vol (A2C):   36.1 ml 21.24 ml/m  RA Volume:   21.80 ml  12.82 ml/m LA  Vol (A4C):   25.8 ml 15.18 ml/m LA Biplane Vol: 32.7 ml 19.24 ml/m  AORTIC VALVE LVOT Vmax:   96.00 cm/s LVOT Vmean:  66.900 cm/s LVOT VTI:    0.172 m AI PHT:      600 msec  AORTA Ao Root diam: 3.00 cm MITRAL VALVE               TRICUSPID VALVE MV Area (PHT): 2.70 cm    TR Peak grad:   21.7 mmHg MV Decel Time: 281 msec    TR Vmax:        233.00 cm/s MV E velocity: 80.60 cm/s MV A velocity: 76.30 cm/s  SHUNTS MV E/A ratio:  1.06        Systemic VTI: 0.17 m Dalton McleanMD Electronically signed by Wilfred Lacy Signature Date/Time: 11/29/2020/5:59:32 PM    Final    ECHO TEE  Result Date: 11/30/2020    TRANSESOPHOGEAL ECHO REPORT   Patient Name:   Alexa Hatfield Date of Exam: 11/30/2020 Medical Rec #:  [id number]          Height:       63.0 in Accession #:    [id number]         Weight:       147.7 lb Date of Birth:  [birth year]         BSA:          1.700 m Patient Age:    49 years            BP:           127/84 mmHg Patient Gender: F                  HR:           116 bpm. Exam Location:  Inpatient Procedure: 3D Echo, Cardiac Doppler, Color Doppler, Transesophageal Echo and            Saline Contrast Bubble Study Indications:    Stroke  History:        Patient has prior history of Echocardiogram examinations, most                 recent 11/29/2020. Stroke.  Sonographer:    Sheralyn Boatman RDCS Referring Phys: [phone] LINDSAY B ROBERTS PROCEDURE: After discussion of the risks and benefits of a TEE, an informed consent was obtained from the patient. The transesophogeal probe was passed without difficulty through the esophogus of the patient. Imaged were obtained with the patient in a left lateral decubitus position. Sedation performed by different physician. The patient was monitored while under deep sedation. Anesthestetic sedation was provided intravenously by Anesthesiology: 291mg  of Propofol. The patient's vital signs; including heart rate, blood pressure, and oxygen saturation; remained stable throughout the procedure. The patient developed no complications during the procedure. IMPRESSIONS  1. Left ventricular ejection fraction, by estimation, is 60 to 65%. The left ventricle has normal function. The left ventricle has no regional wall motion abnormalities.  2. Right ventricular systolic function is normal. The right ventricular size is normal.  3. No left atrial/left atrial appendage thrombus was detected.  4. The mitral valve is normal in structure. Trivial mitral valve regurgitation.  5. The aortic valve is tricuspid. Aortic valve regurgitation is mild.  6. There was a mildly positive bubble study with possible small fenestrations in the interatrial septum, but there is no PFO visualized on numerous views by color doppler interrogation.     Unlikely to be  the source of stroke. FINDINGS  Left Ventricle: Left ventricular ejection fraction, by estimation, is 60 to 65%. The left ventricle has normal  function. The left ventricle has no regional wall motion abnormalities. The left ventricular internal cavity size was normal in size. Right Ventricle: The right ventricular size is normal. No increase in right ventricular wall thickness. Right ventricular systolic function is normal. Left Atrium: Left atrial size was normal in size. No left atrial/left atrial appendage thrombus was detected. Right Atrium: Right atrial size was normal in size. Pericardium: There is no evidence of pericardial effusion. Mitral Valve: The mitral valve is normal in structure. Trivial mitral valve regurgitation. Tricuspid Valve: The tricuspid valve is normal in structure. Tricuspid valve regurgitation is trivial. Aortic Valve: The aortic valve is tricuspid. Aortic valve regurgitation is mild. Pulmonic Valve: The pulmonic valve was normal in structure. Pulmonic valve regurgitation is trivial. Aorta: The aortic root and ascending aorta are structurally normal, with no evidence of dilitation. IAS/Shunts: No atrial level shunt detected by color flow Doppler. Agitated saline contrast was given intravenously to evaluate for intracardiac shunting. Laurance Flatten MD Electronically signed by Laurance Flatten MD Signature Date/Time: 11/30/2020/5:32:42 PM    Final    CT ANGIO HEAD NECK W WO CM (CODE STROKE)  Result Date: 11/29/2020 CLINICAL DATA:  Stroke follow-up EXAM: CT ANGIOGRAPHY HEAD AND NECK TECHNIQUE: Multidetector CT imaging of the head and neck was performed using the standard protocol during bolus administration of intravenous contrast. Multiplanar CT image reconstructions and MIPs were obtained to evaluate the vascular anatomy. Carotid stenosis measurements (when applicable) are obtained utilizing NASCET criteria, using the distal internal carotid diameter as the denominator. CONTRAST:  75mL OMNIPAQUE IOHEXOL 350 MG/ML SOLN COMPARISON:  Same-day MRA head, MR brain and orbits 11/28/2020 FINDINGS: CT HEAD FINDINGS Brain:  Hypodensity in the left thalamus extending to the midbrain is consistent with evolving infarct as seen on the prior brain MRI. The additional small infarcts in the right thalamus is not well seen. There is no new acute territorial infarct. There is no acute intracranial hemorrhage. The ventricles are stable in size. There is no solid mass lesion. There is no midline shift. Vascular: No hyperdense vessel or unexpected calcification. Skull: Normal. Negative for fracture or focal lesion. Sinuses: The paranasal sinuses are clear. Orbits: The globes and orbits are unremarkable. Review of the MIP images confirms the above findings CTA NECK FINDINGS Aortic arch: Standard branching. Imaged portion shows no evidence of aneurysm or dissection. No significant stenosis of the major arch vessel origins. Right carotid system: The right common, internal, and external carotid arteries are patent, without hemodynamically significant stenosis, occlusion, dissection, or aneurysm. Left carotid system: The left common, internal, and external carotid arteries are patent, without hemodynamically significant stenosis, occlusion, dissection, or aneurysm. Vertebral arteries: The proximal right V2 segment is suboptimally evaluated due to significant streak artifact from adjacent venous contamination. The distal V2 and V3 segments are patent, without hemodynamically significant stenosis, occlusion, dissection, or aneurysm. The left vertebral artery is patent, without hemodynamically significant stenosis, occlusion, dissection, or aneurysm. Skeleton: There is no acute osseous abnormality or aggressive osseous lesion. There is no visible canal hematoma. Other neck: The soft tissues are unremarkable. Upper chest: The lung apices are clear. Review of the MIP images confirms the above findings CTA HEAD FINDINGS Anterior circulation: The intracranial ICAs are patent. The bilateral MCAs are patent. The bilateral ACAs are patent. There is no aneurysm.  Posterior circulation: The bilateral V4 segments are patent. The PICA  origins are patent bilaterally. The SCAs are patent bilaterally. The basilar artery is patent. The bilateral PCAs are patent. Bilateral posterior communicating arteries are identified. Venous sinuses: Patent. Anatomic variants: None. Review of the MIP images confirms the above findings IMPRESSION: 1. Expected evolution of the early subacute infarct in the left thalamus extending to the midbrain. The additional small infarcts in the right thalamus is not well seen. 2. Patent vasculature of the head and neck, with no stenosis, occlusion, or dissection. The bilateral PICA origins are patent. Electronically Signed   By: Lesia Hausen M.D.   On: 11/29/2020 10:51   MR ORBITS W WO CONTRAST  Result Date: 11/28/2020 CLINICAL DATA:  eye palsy and visual deficit; visual deficits. Diplopia for 3 days. EXAM: MRI HEAD AND ORBITS WITHOUT AND WITH CONTRAST TECHNIQUE: Multiplanar, multiecho pulse sequences of the brain and surrounding structures were obtained without and with intravenous contrast. Multiplanar, multiecho pulse sequences of the orbits and surrounding structures were obtained including fat saturation techniques, before and after intravenous contrast administration. CONTRAST:  6.50mL GADAVIST GADOBUTROL 1 MMOL/ML IV SOLN COMPARISON:  None. FINDINGS: MRI HEAD FINDINGS Brain: There is a 2 cm acute to early subacute infarct in the left midbrain and thalamus. Additional punctate acute/subacute infarcts are noted in the right thalamus and left middle frontal gyrus. There is a 3 mm focus of cortical enhancement posteriorly in the left temporal lobe without corresponding T2 or diffusion-weighted signal abnormality (series 15, image 18 and series 16, image 10). No abnormal intracranial enhancement is seen elsewhere. No intracranial hemorrhage, mass, midline shift, or extra-axial fluid collection is identified. There is borderline mild cerebral atrophy. No  significant chronic cerebral white matter disease is evident. 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 Vascular: Major intracranial vascular flow voids are preserved. Skull and upper cervical spine: Unremarkable bone marrow signal. Other: None. MRI ORBITS FINDINGS Orbits: The globes appear intact. No optic nerve edema or abnormal enhancement is identified. No orbital mass or inflammation is evident. The extraocular muscles and lacrimal glands are unremarkable. Visualized sinuses: Minimal bilateral ethmoid air cell mucosal thickening. Clear mastoid air cells. Soft tissues: Unremarkable. IMPRESSION: 1. Acute to early subacute infarcts in the left midbrain, thalami, and left frontal lobe. 2. 3 mm focus of cortical enhancement in the left temporal lobe without edema or diffusion abnormality. This is indeterminate but could reflect a subacute infarct or be vascular. Consider a follow-up MRI in 2-3 months to assess for resolution or persistence/progression. 3. Tiny chronic cerebellar infarcts. 4. Unremarkable appearance of the orbits. Electronically Signed   By: Sebastian Ache M.D.   On: 11/28/2020 20:40    Microbiology: Recent Results (from the past 240 hour(s))  Resp Panel by RT-PCR (Flu A&B, Covid) Nasopharyngeal Swab     Status: None   Collection Time: 11/28/20  9:23 PM   Specimen: Nasopharyngeal Swab; Nasopharyngeal(NP) swabs in vial transport medium  Result Value Ref Range Status   SARS Coronavirus 2 by RT PCR NEGATIVE NEGATIVE Final    Comment: (NOTE) SARS-CoV-2 target nucleic acids are NOT DETECTED.  The SARS-CoV-2 RNA is generally detectable in upper respiratory specimens during the acute phase of infection. The lowest concentration of SARS-CoV-2 viral copies this assay can detect is 138 copies/mL. A negative result does not preclude SARS-Cov-2 infection and should not be used as the sole basis for treatment or other patient management  decisions. A negative result may occur with  improper specimen collection/handling, submission of specimen other  than nasopharyngeal swab, presence of viral mutation(s) within the areas targeted by this assay, and inadequate number of viral copies(<138 copies/mL). A negative result must be combined with clinical observations, patient history, and epidemiological information. The expected result is Negative.  Fact Sheet for Patients:  BloggerCourse.com  Fact Sheet for Healthcare Providers:  SeriousBroker.it  This test is no t yet approved or cleared by the [place] FDA and  has been authorized for detection and/or diagnosis of SARS-CoV-2 by FDA under an Emergency Use Authorization (EUA). This EUA will remain  in effect (meaning this test can be used) for the duration of the COVID-19 declaration under Section 564(b)(1) of the Act, 21 U.S.C.section 360bbb-3(b)(1), unless the authorization is terminated  or revoked sooner.       Influenza A by PCR NEGATIVE NEGATIVE Final   Influenza B by PCR NEGATIVE NEGATIVE Final    Comment: (NOTE) The Xpert Xpress SARS-CoV-2/FLU/RSV plus assay is intended as an aid in the diagnosis of influenza from Nasopharyngeal swab specimens and should not be used as a sole basis for treatment. Nasal washings and aspirates are unacceptable for Xpert Xpress SARS-CoV-2/FLU/RSV testing.  Fact Sheet for Patients: BloggerCourse.com  Fact Sheet for Healthcare Providers: SeriousBroker.it  This test is not yet approved or cleared by the [place] FDA and has been authorized for detection and/or diagnosis of SARS-CoV-2 by FDA under an Emergency Use Authorization (EUA). This EUA will remain in effect (meaning this test can be used) for the duration of the COVID-19 declaration under Section 564(b)(1) of the Act, 21 U.S.C. section 360bbb-3(b)(1), unless the  authorization is terminated or revoked.  Performed at [facility], [address] [place], [place] [address]      Labs: Basic Metabolic Panel: Recent Labs  Lab 11/29/20 0346 12/01/20 0531 12/05/20 0629  NA 137 138  --   K 3.8 3.8  --   CL 111 110  --   CO2 18* 22  --   GLUCOSE 91 94  --   BUN 13 13  --   CREATININE 0.97 0.84 0.72  CALCIUM 9.4 8.7*  --   MG  --  2.0  --    Liver Function Tests: Recent Labs  Lab 11/29/20 0346 12/01/20 0531 12/05/20 0629  AST 21 15 19   ALT 27 20 20   ALKPHOS 74 70 66  BILITOT 1.7* 1.8* 0.9  PROT 7.2 6.7 7.0  ALBUMIN 3.5 3.1* 3.4*   No results for input(s): LIPASE, AMYLASE in the last 168 hours. No results for input(s): AMMONIA in the last 168 hours. CBC: Recent Labs  Lab 11/29/20 0346 12/01/20 0531  WBC 10.6* 10.3  HGB 13.7 12.4  HCT 43.1 37.7  MCV 97.5 93.8  PLT 236 235   Cardiac Enzymes: No results for input(s): CKTOTAL, CKMB, CKMBINDEX, TROPONINI in the last 168 hours. BNP: BNP (last 3 results) No results for input(s): BNP in the last 8760 hours.  ProBNP (last 3 results) No results for input(s): PROBNP in the last 8760 hours.  CBG: No results for input(s): GLUCAP in the last 168 hours.     Signed:  12/01/20, MD [facility] 12/05/2020, 5:35 PM

## 2020-12-05 NOTE — Plan of Care (Signed)
  Problem: Education: Goal: Knowledge of disease or condition will improve Outcome: Progressing Goal: Knowledge of secondary prevention will improve (SELECT ALL) Outcome: Progressing Goal: Knowledge of patient specific risk factors will improve (INDIVIDUALIZE FOR PATIENT) Outcome: Progressing Goal: Individualized Educational Video(s) Outcome: Progressing   Problem: Coping: Goal: Will verbalize positive feelings about self Outcome: Progressing Goal: Will identify appropriate support needs Outcome: Progressing   Problem: Self-Care: Goal: Ability to participate in self-care as condition permits will improve Outcome: Progressing Goal: Verbalization of feelings and concerns over difficulty with self-care will improve Outcome: Progressing Goal: Ability to communicate needs accurately will improve Outcome: Progressing   Problem: Ischemic Stroke/TIA Tissue Perfusion: Goal: Complications of ischemic stroke/TIA will be minimized Outcome: Progressing

## 2020-12-05 NOTE — Plan of Care (Signed)
  Problem: Education: Goal: Knowledge of disease or condition will improve 12/05/2020 1249 by Coralie Common, RN Outcome: Adequate for Discharge 12/05/2020 0813 by Coralie Common, RN Outcome: Progressing Goal: Knowledge of secondary prevention will improve (SELECT ALL) 12/05/2020 1249 by Coralie Common, RN Outcome: Adequate for Discharge 12/05/2020 0813 by Coralie Common, RN Outcome: Progressing Goal: Knowledge of patient specific risk factors will improve (INDIVIDUALIZE FOR PATIENT) 12/05/2020 1249 by Coralie Common, RN Outcome: Adequate for Discharge 12/05/2020 0813 by Coralie Common, RN Outcome: Progressing Goal: Individualized Educational Video(s) 12/05/2020 1249 by Coralie Common, RN Outcome: Adequate for Discharge 12/05/2020 0813 by Coralie Common, RN Outcome: Progressing   Problem: Coping: Goal: Will verbalize positive feelings about self 12/05/2020 1249 by Coralie Common, RN Outcome: Adequate for Discharge 12/05/2020 0813 by Coralie Common, RN Outcome: Progressing Goal: Will identify appropriate support needs 12/05/2020 1249 by Coralie Common, RN Outcome: Adequate for Discharge 12/05/2020 0813 by Coralie Common, RN Outcome: Progressing   Problem: Ischemic Stroke/TIA Tissue Perfusion: Goal: Complications of ischemic stroke/TIA will be minimized 12/05/2020 1249 by Coralie Common, RN Outcome: Adequate for Discharge 12/05/2020 0813 by Coralie Common, RN Outcome: Progressing

## 2020-12-05 NOTE — Progress Notes (Signed)
Occupational Therapy Treatment Patient Details Name: Alexa Hatfield MRN: [id number] DOB: Feb 14, 1970 Today's Date: 12/05/2020   History of present illness 50 yo admitted 11/28/20 with double vision and fatigue. 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 S/p TEE 11/23. PMH: complicated Liviu; finger amputation   OT comments  Alexa Hatfield is making great progress. Per report pt's diplopia has resolved, upon assessment it seems like her L visual field is still impaired and educated pt to follow up with eye doctor for full field visual testing. She is completing bed mobility and transfers with mod I, and ambulating/complete BADLs with supervision for safety. She continues to have difficulty with multitasking and benefits from incrased time and verbal cues. Pt continues to benefit from OT acutely. D/c recommendation updated to neuro OP OT.    Recommendations for follow up therapy are one component of a multi-disciplinary discharge planning process, led by the attending physician.  Recommendations may be updated based on patient status, additional functional criteria and insurance authorization.    Follow Up Recommendations  Outpatient OT    Assistance Recommended at Discharge Frequent or constant Supervision/Assistance  Equipment Recommendations  BSC/3in1       Precautions / Restrictions Precautions Precautions: Fall       Mobility Bed Mobility Overal bed mobility: Independent                  Transfers Overall transfer level: Needs assistance Equipment used: None Transfers: Sit to/from Stand Sit to Stand: Modified independent (Device/Increase time)           General transfer comment: no assist needed     Balance Overall balance assessment: Needs assistance Sitting-balance support: No upper extremity supported;Feet supported Sitting balance-Leahy Scale: Normal     Standing  balance support: No upper extremity supported;During functional activity Standing balance-Leahy Scale: Good                   Standardized Balance Assessment Standardized Balance Assessment : Dynamic Gait Index   Dynamic Gait Index Level Surface: Normal Change in Gait Speed: Mild Impairment Gait with Horizontal Head Turns: Moderate Impairment Gait with Vertical Head Turns: Moderate Impairment Gait and Pivot Turn: Mild Impairment Step Over Obstacle: Mild Impairment Step Around Obstacles: Mild Impairment Steps: Normal Total Score: 16     ADL either performed or assessed with clinical judgement   ADL Overall ADL's : Needs assistance/impaired             General ADL Comments: session focused on safety and IADL edcuation: have family assist with higher leve tasks, and cooking/cleaning    Extremity/Trunk Assessment Upper Extremity Assessment RUE Deficits / Details: overall WFL   Lower Extremity Assessment Lower Extremity Assessment: Defer to PT evaluation        Vision   Vision Assessment?: Yes Eye Alignment: Impaired (comment) Ocular Range of Motion: Restricted looking up;Restricted looking down Alignment/Gaze Preference: Within Defined Limits Tracking/Visual Pursuits: Decreased smoothness of vertical tracking;Impaired - to be further tested in functional context Saccades: Additional head turns occurred during testing Visual Fields: Left visual field deficit Additional Comments: pt reports all diplopia has resolved. Peripheral vision is deminished on the L, tracking is more difficult in upper and lower L visual fields          Cognition Arousal/Alertness: Awake/alert Behavior During Therapy: Flat affect Overall Cognitive Status: Impaired/Different from baseline Area of Impairment: Problem solving;Awareness;Safety/judgement;Following commands  Following Commands: Follows multi-step commands with increased time;Follows one step  commands consistently Safety/Judgement: Decreased awareness of safety;Decreased awareness of deficits Awareness: Emergent Problem Solving: Difficulty sequencing;Slow processing;Requires verbal cues General Comments: difficulty with multitasking. good insight to deficits but limited problem solving                General Comments VSS on RA, interpreter and CIR coordinator present this session    Pertinent Vitals/ Pain       Pain Assessment: No/denies pain   Frequency  Min 2X/week        Progress Toward Goals  OT Goals(current goals can now be found in the care plan section)  Progress towards OT goals: Progressing toward goals  Acute Rehab OT Goals Patient Stated Goal: home OT Goal Formulation: With patient Time For Goal Achievement: 12/13/20 Potential to Achieve Goals: Good ADL Goals Pt Will Perform Lower Body Dressing: with modified independence;sit to/from stand Pt Will Transfer to Toilet: with modified independence;ambulating Additional ADL Goal #1: Pt will independently demonstrate ability to manage partial occlusion to improve funcitonal vision Additional ADL Goal #2: Pt will independently verbalize 3 strateiges to reduce risk of falls  Plan Discharge plan needs to be updated    Co-evaluation                 AM-PAC OT "6 Clicks" Daily Activity     Outcome Measure   Help from another person eating meals?: None Help from another person taking care of personal grooming?: A Little Help from another person toileting, which includes using toliet, bedpan, or urinal?: A Little Help from another person bathing (including washing, rinsing, drying)?: A Little Help from another person to put on and taking off regular upper body clothing?: A Little Help from another person to put on and taking off regular lower body clothing?: A Little 6 Click Score: 19    End of Session    OT Visit Diagnosis: Unsteadiness on feet (R26.81);Muscle weakness (generalized)  (M62.81);Other abnormalities of gait and mobility (R26.89);Low vision, both eyes (H54.2);Other symptoms and signs involving cognitive function;Other symptoms and signs involving the nervous system (R29.898);Apraxia (R48.2)   Activity Tolerance Patient tolerated treatment well   Patient Left in chair;with chair alarm set;with call bell/phone within reach   Nurse Communication Mobility status        Time: 1150-1205 OT Time Calculation (min): 15 min  Charges: OT General Charges $OT Visit: 1 Visit OT Treatments $Therapeutic Activity: 8-22 mins  Aaric Dolph A Xzayvier Fagin 12/05/2020, 1:47 PM

## 2020-12-06 ENCOUNTER — Telehealth: Payer: Self-pay | Admitting: Cardiology

## 2020-12-06 LAB — ANTIPHOSPHOLIPID SYNDROME EVAL, BLD
Anticardiolipin IgA: <9 APL U/mL (ref 0–11)
Anticardiolipin IgG: 9 GPL U/mL (ref 0–14)
Anticardiolipin IgM: 13 MPL U/mL — ABNORMAL HIGH (ref 0–12)
DRVVT: 42.7 s (ref 0.0–47.0)
PTT Lupus Anticoagulant: 36.9 s (ref 0.0–51.9)
Phosphatydalserine, IgA: <1 APS Units (ref 0–19)
Phosphatydalserine, IgG: 9 Units (ref 0–30)
Phosphatydalserine, IgM: 44 Units — ABNORMAL HIGH (ref 0–30)

## 2020-12-06 LAB — ANTINUCLEAR ANTIBODIES, IFA: ANA Ab, IFA: NEGATIVE

## 2020-12-06 NOTE — Telephone Encounter (Signed)
Daughter of the patient called to follow up on the patient's heart monitor that was ordered in the hospital.   Please call the daughter. The patient does not speak [place]

## 2020-12-06 NOTE — Telephone Encounter (Signed)
Left message to call back  

## 2020-12-07 NOTE — Telephone Encounter (Signed)
Follow Up:      Daughter is returning Alexa Hatfield's call from yesterday.

## 2020-12-08 LAB — MTHFR DNA ANALYSIS

## 2020-12-08 NOTE — Telephone Encounter (Signed)
Returned call to patients daughter and answered all her questions about the monitor that was ordered for her mom. Patients daughter knows to call me if she needs help applying the monitor

## 2020-12-11 NOTE — Anesthesia Postprocedure Evaluation (Signed)
Anesthesia Post Note  Patient: Alexa Hatfield  Procedure(s) Performed: TRANSESOPHAGEAL ECHOCARDIOGRAM (TEE) BUBBLE STUDY     Patient location during evaluation: PACU Anesthesia Type: MAC Level of consciousness: awake and alert Pain management: pain level controlled Vital Signs Assessment: post-procedure vital signs reviewed and stable Respiratory status: spontaneous breathing, nonlabored ventilation, respiratory function stable and patient connected to nasal cannula oxygen Cardiovascular status: blood pressure returned to baseline and stable Postop Assessment: no apparent nausea or vomiting Anesthetic complications: no   No notable events documented.  Last Vitals:  Vitals:   12/05/20 0855 12/05/20 1220  BP: 129/90 118/84  Pulse: 71 90  Resp:  18  Temp: 36.8 C 37.2 C  SpO2: 99% 97%    Last Pain:  Vitals:   12/05/20 1226  TempSrc:   PainSc: 0-No pain                 Dwayna Kentner L Kamau Weatherall

## 2020-12-13 ENCOUNTER — Ambulatory Visit (INDEPENDENT_AMBULATORY_CARE_PROVIDER_SITE_OTHER): Payer: 59

## 2020-12-13 DIAGNOSIS — I4891 Unspecified atrial fibrillation: Secondary | ICD-10-CM

## 2020-12-13 DIAGNOSIS — I639 Cerebral infarction, unspecified: Secondary | ICD-10-CM | POA: Diagnosis not present

## 2020-12-14 ENCOUNTER — Encounter: Payer: Self-pay | Admitting: Physical Therapy

## 2020-12-14 ENCOUNTER — Other Ambulatory Visit: Payer: Self-pay

## 2020-12-14 ENCOUNTER — Encounter: Payer: Self-pay | Admitting: Occupational Therapy

## 2020-12-14 ENCOUNTER — Encounter: Payer: Self-pay | Admitting: Speech Pathology

## 2020-12-14 ENCOUNTER — Ambulatory Visit: Payer: 59 | Attending: Internal Medicine | Admitting: Physical Therapy

## 2020-12-14 ENCOUNTER — Ambulatory Visit: Payer: 59 | Admitting: Speech Pathology

## 2020-12-14 ENCOUNTER — Ambulatory Visit: Payer: 59 | Admitting: Occupational Therapy

## 2020-12-14 DIAGNOSIS — R41841 Cognitive communication deficit: Secondary | ICD-10-CM | POA: Diagnosis present

## 2020-12-14 DIAGNOSIS — I639 Cerebral infarction, unspecified: Secondary | ICD-10-CM | POA: Insufficient documentation

## 2020-12-14 DIAGNOSIS — I69319 Unspecified symptoms and signs involving cognitive functions following cerebral infarction: Secondary | ICD-10-CM | POA: Insufficient documentation

## 2020-12-14 DIAGNOSIS — R29818 Other symptoms and signs involving the nervous system: Secondary | ICD-10-CM | POA: Diagnosis present

## 2020-12-14 DIAGNOSIS — R2681 Unsteadiness on feet: Secondary | ICD-10-CM | POA: Diagnosis present

## 2020-12-14 NOTE — Therapy (Signed)
[facility] [address] [place], [place], [address] Phone: [phone]   Fax:  [phone]  Physical Therapy Evaluation  Patient Details  Name: Alexa Hatfield MRN: [id number] Date of Birth: 09-20-1970 Referring Provider (PT): Dow Adolph   Encounter Date: 12/14/2020   PT End of Session - 12/14/20 1635     Visit Number 1    Number of Visits 1    Date for PT Re-Evaluation 12/14/20    Authorization Type [employer]    PT Start Time 1533    PT Stop Time 1601    PT Time Calculation (min) 28 min    Activity Tolerance Patient tolerated treatment well    Behavior During Therapy [facility] for tasks assessed/performed             Past Medical History:  Diagnosis Date   Complication of anesthesia    "difficulty breathing and required O2 during c-section"   Medical history non-contributory     Past Surgical History:  Procedure Laterality Date   AMPUTATION Left 02/25/2018   Procedure: AMPUTATION OF FINGER;  Surgeon: Knute Neu, MD;  Location: [facility] OR;  Service: Plastics;  Laterality: Left;   BUBBLE STUDY  11/30/2020   Procedure: BUBBLE STUDY;  Surgeon: [name], MD;  Location: [facility] ENDOSCOPY;  Service: Cardiovascular;;   CESAREAN SECTION     x2   TEE WITHOUT CARDIOVERSION N/A 11/30/2020   Procedure: TRANSESOPHAGEAL ECHOCARDIOGRAM (TEE);  Surgeon: [name], MD;  Location: [facility] ENDOSCOPY;  Service: Cardiovascular;  Laterality: N/A;    There were no vitals filed for this visit.    Subjective Assessment - 12/14/20 1536     Subjective I started feeling back to my normal about 2 days after discharging from the hospital. I still have to wear the [employer] monitor for another week. My balance is better, no  more double vision. I'm really feeling good now.    Patient Stated Goals just make sure everything is OK and I have no more problems    Currently in Pain? No/denies                [facility] PT Assessment -  12/14/20 0001       Assessment   Medical Diagnosis CVA    Referring Provider (PT) Dow Adolph    Onset Date/Surgical Date --   November 2022   Next MD Visit January 5th 2023 with PCP    Prior Therapy yes in her home country      Precautions   Precautions None      Restrictions   Weight Bearing Restrictions No      Balance Screen   Has the patient fallen in the past 6 months No    Has the patient had a decrease in activity level because of a fear of falling?  No    Is the patient reluctant to leave their home because of a fear of falling?  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    Leisure playing with kids, visiting my country, going to the park      ROM / Strength   AROM / PROM / Strength Strength      Strength   Strength Assessment Site Hip;Knee  Right/Left Hip Right;Left    Right Hip Extension 4+/5    Right Hip ABduction 4+/5    Left Hip Extension 4+/5    Left Hip ABduction 5/5    Right/Left Knee Right;Left    Right Knee Flexion 4+/5    Right Knee Extension 5/5    Left Knee Flexion 4+/5    Left Knee Extension 5/5      Standardized Balance Assessment   Standardized Balance Assessment Dynamic Gait Index      Dynamic Gait Index   Level Surface Normal    Change in Gait Speed Normal    Gait with Horizontal Head Turns Mild Impairment    Gait with Vertical Head Turns Mild Impairment    Gait and Pivot Turn Normal    Step Over Obstacle Normal    Step Around Obstacles Normal    Steps Normal    Total Score 22                        Objective measurements completed on examination: See above findings.                PT Education - 12/14/20 1634     Education Details strength, balance, and gait all WNL, no need for  skilled PT services; signs and symptoms of another stroke that would require return to the hospital for care    Person(s) Educated Patient    Methods Explanation    Comprehension Verbalized understanding              PT Short Term Goals - 12/14/20 1640       PT SHORT TERM GOAL #1   Title Will be aware of signs/symptoms of another possible CVA that would require return to the hospital    Time 1    Period Days    Status Achieved                       Plan - 12/14/20 1636     Clinical Impression Statement Ms. Alexa Hatfield arrives today doing well- reports that the remainder of her stroke symptoms resolved within a couple days of being discharged from the hospital. Gait, strength, and functional balance skills are all WNL per assessment today. At this point I do not feel she is in need of skilled PT services- thank you for the opportunity to participate in her care.    Personal Factors and Comorbidities Age;Fitness;Past/Current Experience;Time since onset of injury/illness/exacerbation    Examination-Activity Limitations Locomotion Level    Examination-Participation Restrictions Yard Work;Occupation    Stability/Clinical Decision Making Stable/Uncomplicated    Clinical Decision Making Low    Rehab Potential Excellent    PT Frequency Other (comment)   Eval only   PT Duration Other (comment)   eval only   PT Treatment/Interventions ADLs/Self Care Home Management;Gait training;Stair training;Functional mobility training;Therapeutic activities;Therapeutic exercise;Balance training;Neuromuscular re-education;Patient/family education    PT Next Visit Plan eval only    PT Home Exercise Plan none    Consulted and Agree with Plan of Care Patient             Patient will benefit from skilled therapeutic intervention in order to improve the following deficits and impairments:  Decreased balance, Decreased coordination  Visit Diagnosis: Unsteadiness on feet     Problem  List Patient Active Problem List   Diagnosis Date Noted   CVA (cerebral vascular accident) (HCC) 11/29/2020   Ischemic stroke (HCC) 11/28/2020  Madelaine Etienne, DPT, PN2   Supplemental Physical Therapist [facility]    Pager [phone] Acute Rehab Office [phone]   [facility] [address] [place], [place], [address] Phone: [phone]   Fax:  [phone]  Name: Alexa Hatfield MRN: [id number] Date of Birth: 1970-05-20

## 2020-12-15 NOTE — Therapy (Signed)
[facility] [address] [place], [place], [address] Phone: [phone]   Fax:  [phone]  Speech Language Pathology Evaluation  Patient Details  Name: Alexa Hatfield MRN: [id number] Date of Birth: [birth year] Referring Provider (SLP): Darlin Drop, [place]   Encounter Date: 12/14/2020   End of Session - 12/15/20 5176     Visit Number 1    Number of Visits 12    Date for SLP Re-Evaluation 03/15/21    SLP Start Time 1610    SLP Stop Time  1655    SLP Time Calculation (min) 45 min    Activity Tolerance Patient tolerated treatment well             Past Medical History:  Diagnosis Date   Complication of anesthesia    "difficulty breathing and required O2 during c-section"   Medical history non-contributory     Past Surgical History:  Procedure Laterality Date   AMPUTATION Left 02/25/2018   Procedure: AMPUTATION OF FINGER;  Surgeon: Knute Neu, MD;  [facility];  Service: Plastics;  Laterality: Left;   BUBBLE STUDY  11/30/2020   Procedure: BUBBLE STUDY;  Surgeon: [name], MD;  Location: [facility] ENDOSCOPY;  Service: Cardiovascular;;   CESAREAN SECTION     x2   TEE WITHOUT CARDIOVERSION N/A 11/30/2020   Procedure: TRANSESOPHAGEAL ECHOCARDIOGRAM (TEE);  Surgeon: [name], MD;  Location: [facility] ENDOSCOPY;  Service: Cardiovascular;  Laterality: N/A;    There were no vitals filed for this visit.   Subjective Assessment - 12/14/20 1613     Subjective Pt was pleasant and cooperative throughout assessment.    Currently in Pain? No/denies                SLP Evaluation [facility] - 12/14/20 1613       SLP Visit Information   SLP Received On 12/14/20    Referring Provider (SLP) Darlin Drop, DO    Onset Date 11/28/20    Medical Diagnosis CVA      Subjective   Patient/Family Stated Goal To get back to work      General Information   HPI Alexa Hatfield is an 50 y.o. female no  significant past medical history comes into the hospital for evaluation of diplopia and fatigue that started 3 days prior to admission.  CT scan of the head in the [place] showed no acute findings according to the patient.  Symptoms were attributed to birth control pills which were started on 11/11/2020.  Work-up concerning for acute early subacute infarct of the left midbrain thalamus and left frontal lobe as well as 3 mm focus cortical enhancement of the left temporal lobe without edema, MRI of the orbit was unremarkable.  Post TEE on 11/30/2020 showed aneurysmal interatrial septum with possible small PFO.  Per neurology and cardiology unlikely to be source of stroke.      Balance Screen   Has the patient fallen in the past 6 months No    Has the patient had a decrease in activity level because of a fear of falling?  No    Is the patient reluctant to leave their home because of a fear of falling?  No      Prior Functional Status   Cognitive/Linguistic Baseline Within functional limits    Type of Home House     Lives With Family    Available Support Family    Education High school  Vocation Full time employment      Cognition   Overall Cognitive Status Impaired/Different from baseline    Area of Impairment Attention;Memory;Awareness    Current Attention Level Selective    Memory Impaired    Memory Impairment Storage deficit;Decreased recall of new information    Awareness Impaired    Awareness Impairment Emergent impairment    Scientist, physiological Comprehension   Overall Auditory Comprehension Appears within functional limits for tasks assessed      Verbal Expression   Overall Verbal Expression Appears within functional limits for tasks assessed      Motor Speech   Overall Motor Speech Appears within functional limits for tasks assessed      Standardized Assessments   Standardized Assessments  Other Assessment    Other Assessment SLUMS and CLQT  subtests      Individuals Consulted   Consulted and Agree with Results and Recommendations Patient             SLU Mental Status (SLUMS Examination)  Orientation: 3/3  Delayed Recall w/ Interference: 4/5  Numeric Calculation and Registration: 3/3  Immediate Recall w/ Interference (Generative naming): 2/3  Registration and Digit Span: 1/2  Visual Spatial/Exec Functioning: 4/6  Executive Functioning/Extrapolation:  2/8  Total: 19/30                SLP Education - 12/15/20 0833     Education Details Cognitive-communication impairments    Person(s) Educated Patient    Methods Explanation;Demonstration    Comprehension Verbalized understanding              SLP Short Term Goals - 12/15/20 0913       SLP SHORT TERM GOAL #1   Title Pt will comprehend functional memory or visual aids for recall of important information during structured conversations with minA.    Time 6    Period Weeks    Status New    Target Date 01/26/21      SLP SHORT TERM GOAL #2   Title Pt will increase auditory comprehension by recalling and verbalizing strategies to assist with active listening during conversation with minA.    Time 6    Period Weeks    Status New    Target Date 01/26/21              SLP Long Term Goals - 12/15/20 0912       SLP LONG TERM GOAL #1   Title Pt will demonstrate understanding and verbalize use of functional memory/visual aids to recall important information in unstructured conversations independently.    Time 12    Period Weeks    Status New    Target Date 03/09/21      SLP LONG TERM GOAL #2   Title Pt will increase auditory comprehension by recalling and verbalizing strategies to assist with active listening during unstructured conversation independently.    Time 12    Period Weeks    Status New    Target Date 03/09/21              Plan - 12/15/20 0853     Clinical Impression Statement Pt is a 50 yo female who presents  for OP ST evaluation post CVA on 11/28/20. *Assessment was conducted in patient's L1 with interpreter present. During case hx collection, pt reported, she "didn't know why she is here for speech therapy". SLP explained SLP role and reviewed results from SLUMS in the hospital. Pt  demonstrated understanding and was in agreement to cont with assessment. Pt did not endorse any changes in thinking or language skills (which is consistent with her feelings in the hospital). SLP screened pt using SLUMS to determine improvements made. Pt scored a 19/30 indicating a 6 point improvement from her SLUMS in the hospital.  SLP also began assessing pt using CLQT subtests. Pt scores were the following re: Personal Facts 10/10, Confrontation Naming 10/10, Story Retell 7/10 and Generative Naming 3/9. Pt demonstrated decreased processing speed, difficulty retaining auditory information, and impaired active listening. She benefited from repetition of directions to increase comprehension of information. When results were shared, pt reported she thought that if SLP asked her again, she would be able to complete assessment with no problems. SLP provided pt with opportunities to problem solve during assessment and pt continued to require assistance solving problems (clock drawing). 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    Speech Therapy Frequency 1x /week    Duration 12 weeks    Treatment/Interventions Cueing hierarchy;Functional tasks;Patient/family education;Environmental controls;Cognitive reorganization;Multimodal communcation approach;Language facilitation;Compensatory techniques;SLP instruction and feedback;Internal/external aids    Potential to Achieve Goals Good    Consulted and Agree with Plan of Care Patient             Patient will benefit from skilled therapeutic intervention in order to improve the  following deficits and impairments:   Cognitive communication deficit    Problem List Patient Active Problem List   Diagnosis Date Noted   CVA (cerebral vascular accident) (HCC) 11/29/2020   Ischemic stroke (HCC) 11/28/2020    Michelene Gardener [place], CCC-SLP 12/15/2020, 9:15 AM  [facility] [address] [place], [place], [address] Phone: [phone]   Fax:  [phone]  Name: Alexa Hatfield MRN: [id number] Date of Birth: [birth year]

## 2020-12-15 NOTE — Therapy (Signed)
[place]. [place], [place], [address] Phone: [phone]   Fax:  [phone]  Occupational Therapy Evaluation  Patient Details  Name: Alexa Hatfield MRN: [id number] Date of Birth: [birth year] No data recorded  Encounter Date: 12/14/2020   OT End of Session - 12/14/20 1853     Visit Number 1    Number of Visits 1    Authorization Type [employer]    Authorization - Visit Number 3    Authorization - Number of Visits 80   Combined OT/PT/ST   OT Start Time 1700    OT Stop Time 1745    OT Time Calculation (min) 45 min    Activity Tolerance Patient tolerated treatment well    Behavior During Therapy [facility] for tasks assessed/performed            Past Medical History:  Diagnosis Date   Complication of anesthesia    "difficulty breathing and required O2 during c-section"   Medical history non-contributory     Past Surgical History:  Procedure Laterality Date   AMPUTATION Left 02/25/2018   Procedure: AMPUTATION OF FINGER;  Surgeon: Dayna Barker, MD;  Location: [place];  Service: Plastics;  Laterality: Left;   BUBBLE STUDY  11/30/2020   Procedure: BUBBLE STUDY;  Surgeon: Freada Bergeron, MD;  Location: [place];  Service: Cardiovascular;;   CESAREAN SECTION     x2   TEE WITHOUT CARDIOVERSION N/A 11/30/2020   Procedure: TRANSESOPHAGEAL ECHOCARDIOGRAM (TEE);  Surgeon: Freada Bergeron, MD;  Location: [facility] ENDOSCOPY;  Service: Cardiovascular;  Laterality: N/A;    There were no vitals filed for this visit.   Subjective Assessment - 12/14/20 1707     Subjective  Pt reports she presented to the ED on 11/28/20 due to persistent diplopia; states she could not see anything out of her R eye. She was in the hospital for 6 days, during which her symptoms improved, but pt states she still was not feeling herself until a few days after d/c home. Pt reports that since that time she has been doing well, denies new  symptoms, and does not have any concerns regarding functional activities at this time.    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 No/denies             [facility] OT Assessment - 12/14/20 1712       Assessment   Medical Diagnosis Cryptogenic stroke    Referring Provider (OT) Irene Pap, DO    Onset Date/Surgical Date 11/28/20    Hand Dominance Right    Prior Therapy OT for finger in 2020      Precautions   Precautions None    Precaution Comments Currently wearing a heart monitor      Balance Screen   Has the patient fallen in the past 6 months No      Home  Environment   Type of [place] One level    Bathroom Shower/Tub Tub/Shower unit    Home Equipment Bedside commode   Not using BSC     Prior Function   Level of Independence Independent    Vocation Full time employment    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    Leisure playing with kids, visiting [place] going to the park      ADL   ADL comments Ind w/ all BADLs at this time      IADL   [place] alone or with occasional assistance    Meal Prep Plans, prepares and serves adequate meals independently    Programmer, applications own vehicle   Is not driving currently; unclear if she is medically cleared to drive   Medication Management Is responsible for taking medication in correct dosages at correct time      Mobility   Mobility Status Independent      Vision - History   Baseline Vision Wears glasses only for reading    Additional Comments Reports diplopia resolved in the hospital; denies additional visual concerns/disturbances at this time (dizziness, headaches, difficulty w/ focus)       Vision Assessment   Ocular Range of Motion Within Functional Limits    Tracking/Visual Pursuits Able to track stimulus in all quads without difficulty    Saccades Decreased speed of saccadic movement    Convergence Within functional limits    Visual Fields --   Potential mild far peripheral visual field deficit per gross assessment; encouraged pt to follow up w/ optometrist/ophthalmologist for complete visual exam and/or visual field testing     Cognition   Overall Cognitive Status Within Functional Limits for tasks assessed    Cognition Comments Reports some difficulty w/ attention and memory      Sensation   Light Touch Appears Intact    Hot/Cold Appears Intact      Coordination   Gross Motor Movements are Fluid and Coordinated Yes    Fine Motor Movements are Fluid and Coordinated Yes    9 Hole Peg Test Right;Left    Right 9 Hole Peg Test 25 sec    Left 9 Hole Peg Test 29 sec      ROM / Strength   AROM / PROM / Strength AROM;Strength      AROM   Overall AROM  Within functional limits for tasks performed    AROM Assessment Site Shoulder;Elbow;Forearm;Wrist;Finger;Thumb      Strength   Overall Strength Within functional limits for tasks performed    Strength Assessment Site Shoulder;Elbow    Right/Left Shoulder Right;Left    Right Shoulder Flexion 4+/5    Right Shoulder Extension 4+/5    Left Shoulder Flexion 4+/5    Left Shoulder Extension 4+/5    Right/Left Elbow Right;Left    Right Elbow Flexion 4+/5    Right Elbow Extension 4+/5    Left Elbow Flexion 4+/5    Left Elbow Extension 4+/5      Hand Function   Right Hand Gross Grasp Functional    Left Hand Gross Grasp Functional             OT Education - 12/14/20 1850     Education Details Education provided on role and purpose of OT. Also discussed potential return to driving, strongly encouraging pt discuss this w/ MD prior to attempting    Person(s) Educated Patient    Methods Explanation    Comprehension  Verbalized understanding             OT Short Term Goals - 12/14/20 1857       OT SHORT TERM GOAL #1   Title Pt will verbalize understanding of role/purpose of occupational therapy services and be able to independently  identify if/when she may need to seek a new referral for services to assist w/ participation in functional activities    Status Achieved   12/15/20   Target Date 12/15/20             Plan - 12/15/20 1854     Clinical Impression Statement Pt is a 50 y/o female who presents to OP OT s/p cryptogenic stroke; MRI indicated acute to subacute infarcts in L midbrain, thalami, and L frontal lobe. Pt currently lives in a single level home and was working for [employer], cutting and repairing wood products, prior to onset. 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 Strength, coordination, sensation, and vision all WFL and pt denies any functional concerns related to BADLs or IADLs. Pt affirms understanding of secondary stroke prevention and recognition of signs of stroke, and OT discussed benefit of discussing potential return to driving w/ MD at upcoming appt. Due to absence of functional deficits, skilled occupational therapy services are not warranted at this time; pt was agreeable and encouraged to call back with any specific changes or development of limitations during functional activities.    OT Occupational Profile and History Detailed Assessment- Review of Records and additional review of physical, cognitive, psychosocial history related to current functional performance    Occupational performance deficits (Please refer to evaluation for details): Work;IADL's    Body Structure / Function / Physical Skills IADL;Sensation    Cognitive Skills Memory;Attention    Clinical Decision Making Limited treatment options, no task modification necessary    Comorbidities Affecting Occupational Performance: May have comorbidities impacting  occupational performance    Modification or Assistance to Complete Evaluation  No modification of tasks or assist necessary to complete eval    OT Frequency One time visit    OT Treatment/Interventions Patient/family education;Visual/perceptual remediation/compensation    Plan Skilled OT services are not warranted at this time; pt encouraged to call back w/ any changes in funtional status or limitations    Recommended Other Services Pt receiving SLP services at this location    Consulted and Agree with Plan of Care Patient            Patient will benefit from skilled therapeutic intervention in order to improve the following deficits and impairments:   Body Structure / Function / Physical Skills: IADL, Sensation Cognitive Skills: Memory, Attention   Visit Diagnosis: Unspecified symptoms and signs involving cognitive functions following cerebral infarction  Other symptoms and signs involving the nervous system   Problem List Patient Active Problem List   Diagnosis Date Noted   CVA (cerebral vascular accident) (HCC) 11/29/2020   Ischemic stroke (HCC) 11/28/2020    Rosie Fate, OTR/L, MSOT 12/15/2020, 6:59 PM  [facility] [address] [place], [place], [address] Phone: [phone]   Fax:  [phone]  Name: Alexa Hatfield MRN: [id number] Date of Birth: May 13, 1970

## 2020-12-21 ENCOUNTER — Ambulatory Visit: Payer: 59 | Admitting: Speech Pathology

## 2020-12-28 ENCOUNTER — Other Ambulatory Visit: Payer: Self-pay

## 2020-12-28 ENCOUNTER — Ambulatory Visit: Payer: 59 | Admitting: Speech Pathology

## 2020-12-28 DIAGNOSIS — R41841 Cognitive communication deficit: Secondary | ICD-10-CM

## 2020-12-28 DIAGNOSIS — R2681 Unsteadiness on feet: Secondary | ICD-10-CM | POA: Diagnosis not present

## 2020-12-28 NOTE — Therapy (Signed)
[facility] [address] [place], [place], [address] Phone: [phone]   Fax:  [phone]  Speech Language Pathology Treatment  Patient Details  Name: Alexa Hatfield MRN: [id number] Date of Birth: 05-07-1970 Referring Provider (SLP): Darlin Drop, [place]   Encounter Date: 12/28/2020   End of Session - 12/28/20 1704     Visit Number 2    Number of Visits 12    Date for SLP Re-Evaluation 03/15/21    SLP Start Time 1530    SLP Stop Time  1610    SLP Time Calculation (min) 40 min    Activity Tolerance Patient tolerated treatment well             Past Medical History:  Diagnosis Date   Complication of anesthesia    "difficulty breathing and required O2 during c-section"   Medical history non-contributory     Past Surgical History:  Procedure Laterality Date   AMPUTATION Left 02/25/2018   Procedure: AMPUTATION OF FINGER;  Surgeon: Knute Neu, MD;  Location: [facility] OR;  Service: Plastics;  Laterality: Left;   BUBBLE STUDY  11/30/2020   Procedure: BUBBLE STUDY;  Surgeon: [name], MD;  Location: [facility] ENDOSCOPY;  Service: Cardiovascular;;   CESAREAN SECTION     x2   TEE WITHOUT CARDIOVERSION N/A 11/30/2020   Procedure: TRANSESOPHAGEAL ECHOCARDIOGRAM (TEE);  Surgeon: [name], MD;  Location: [facility] ENDOSCOPY;  Service: Cardiovascular;  Laterality: N/A;    There were no vitals filed for this visit.   Subjective Assessment - 12/28/20 1703     Subjective "I don't feel like I have any problems."    Currently in Pain? No/denies                   ADULT SLP TREATMENT - 12/28/20 1701       General Information   Behavior/Cognition Alert;Cooperative      Treatment Provided   Treatment provided Cognitive-Linquistic      Cognitive-Linquistic Treatment   Treatment focused on Cognition    Skilled Treatment Began training in attention strategies to increase comprehension of verbal  information this session. Pt reported she doesn't feel like she is having any issues with her thinking at all. SLP discussed the scoring from previous assessment. Pt reported, "the test was too hard."      Assessment / Recommendations / Plan   Plan Continue with current plan of care      Progression Toward Goals   Progression toward goals Progressing toward goals                SLP Short Term Goals - 12/15/20 0913       SLP SHORT TERM GOAL #1   Title Pt will comprehend functional memory or visual aids for recall of important information during structured conversations with minA.    Time 6    Period Weeks    Status New    Target Date 01/26/21      SLP SHORT TERM GOAL #2   Title Pt will increase auditory comprehension by recalling and verbalizing strategies to assist with active listening during conversation with minA.    Time 6    Period Weeks    Status New    Target Date 01/26/21              SLP Long Term Goals - 12/15/20 0912       SLP LONG TERM GOAL #1   Title Pt will  demonstrate understanding and verbalize use of functional memory/visual aids to recall important information in unstructured conversations independently.    Time 12    Period Weeks    Status New    Target Date 03/09/21      SLP LONG TERM GOAL #2   Title Pt will increase auditory comprehension by recalling and verbalizing strategies to assist with active listening during unstructured conversation independently.    Time 12    Period Weeks    Status New    Target Date 03/09/21              Plan - 12/28/20 1704     Clinical Impression Statement See tx note. 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    Speech Therapy Frequency 1x /week    Duration 12 weeks    Treatment/Interventions Cueing hierarchy;Functional tasks;Patient/family education;Environmental controls;Cognitive  reorganization;Multimodal communcation approach;Language facilitation;Compensatory techniques;SLP instruction and feedback;Internal/external aids    Potential to Achieve Goals Good    Consulted and Agree with Plan of Care Patient             Patient will benefit from skilled therapeutic intervention in order to improve the following deficits and impairments:   Cognitive communication deficit    Problem List Patient Active Problem List   Diagnosis Date Noted   CVA (cerebral vascular accident) (HCC) 11/29/2020   Ischemic stroke (HCC) 11/28/2020    Michelene Gardener [place], CCC-SLP 12/28/2020, 5:06 PM  [facility] [address] [place], [place], [address] Phone: [phone]   Fax:  [phone]   Name: Tyanne Derocher MRN: [id number] Date of Birth: 12/01/1970

## 2021-01-04 ENCOUNTER — Ambulatory Visit: Payer: 59 | Admitting: Speech Pathology

## 2021-01-04 ENCOUNTER — Other Ambulatory Visit: Payer: Self-pay

## 2021-01-04 DIAGNOSIS — R41841 Cognitive communication deficit: Secondary | ICD-10-CM

## 2021-01-04 DIAGNOSIS — R2681 Unsteadiness on feet: Secondary | ICD-10-CM | POA: Diagnosis not present

## 2021-01-04 NOTE — Therapy (Signed)
[facility] [address] [place], [place], [address] Phone: [phone]   Fax:  [phone]  Speech Language Pathology Treatment  Patient Details  Name: Alexa Hatfield MRN: [id number] Date of Birth: Nov 07, 1970 Referring Provider (SLP): Darlin Drop, [place]   Encounter Date: 01/04/2021   End of Session - 01/04/21 1611     Visit Number 3    Number of Visits 12    Date for SLP Re-Evaluation 03/15/21    SLP Start Time 1500    SLP Stop Time  1540    SLP Time Calculation (min) 40 min    Activity Tolerance Patient tolerated treatment well             Past Medical History:  Diagnosis Date   Complication of anesthesia    "difficulty breathing and required O2 during c-section"   Medical history non-contributory     Past Surgical History:  Procedure Laterality Date   AMPUTATION Left 02/25/2018   Procedure: AMPUTATION OF FINGER;  Surgeon: Knute Neu, MD;  Location: [facility] OR;  Service: Plastics;  Laterality: Left;   BUBBLE STUDY  11/30/2020   Procedure: BUBBLE STUDY;  Surgeon: [name], MD;  Location: [facility] ENDOSCOPY;  Service: Cardiovascular;;   CESAREAN SECTION     x2   TEE WITHOUT CARDIOVERSION N/A 11/30/2020   Procedure: TRANSESOPHAGEAL ECHOCARDIOGRAM (TEE);  Surgeon: [name], MD;  Location: [facility] ENDOSCOPY;  Service: Cardiovascular;  Laterality: N/A;    There were no vitals filed for this visit.   Subjective Assessment - 01/04/21 1503     Subjective Pt reports feeling like she is "doing fine"    Patient is accompained by: Interpreter    Currently in Pain? No/denies                   ADULT SLP TREATMENT - 01/04/21 1608       General Information   Behavior/Cognition Alert;Cooperative      Treatment Provided   Treatment provided Cognitive-Linquistic      Cognitive-Linquistic Treatment   Treatment focused on Cognition    Skilled Treatment Reviewed attention strategies. Pt  chose two strategies that she thought may be beneficial. After further discussion, pt misunderstood HEP and wrote two ways she could use 1 strategy. Pt appeared to have comprehension difficulties today with questions SLP asked regarding memory strategies; however, this could be due to interpretation of information vs. comprehension. Pt reported she feels fine and that therapy is expensive. SLP spoke with pt about completing memory strategies and then returning to therapy if she finds she has trouble when she returns to work... to cont discussion.      Assessment / Recommendations / Plan   Plan Continue with current plan of care      Progression Toward Goals   Progression toward goals Progressing toward goals                SLP Short Term Goals - 12/15/20 0913       SLP SHORT TERM GOAL #1   Title Pt will comprehend functional memory or visual aids for recall of important information during structured conversations with minA.    Time 6    Period Weeks    Status New    Target Date 01/26/21      SLP SHORT TERM GOAL #2   Title Pt will increase auditory comprehension by recalling and verbalizing strategies to assist with active listening during conversation with minA.  Time 6    Period Weeks    Status New    Target Date 01/26/21              SLP Long Term Goals - 12/15/20 0912       SLP LONG TERM GOAL #1   Title Pt will demonstrate understanding and verbalize use of functional memory/visual aids to recall important information in unstructured conversations independently.    Time 12    Period Weeks    Status New    Target Date 03/09/21      SLP LONG TERM GOAL #2   Title Pt will increase auditory comprehension by recalling and verbalizing strategies to assist with active listening during unstructured conversation independently.    Time 12    Period Weeks    Status New    Target Date 03/09/21              Plan - 01/04/21 1612     Clinical Impression Statement  See tx note. 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 Cont with current POC. Possible d/c next session due to limited pt motivation to complete therapy (possibly 2/2 to limited awareness).    Speech Therapy Frequency 1x /week    Duration 12 weeks    Treatment/Interventions Cueing hierarchy;Functional tasks;Patient/family education;Environmental controls;Cognitive reorganization;Multimodal communcation approach;Language facilitation;Compensatory techniques;SLP instruction and feedback;Internal/external aids    Potential to Achieve Goals Good    Consulted and Agree with Plan of Care Patient             Patient will benefit from skilled therapeutic intervention in order to improve the following deficits and impairments:   Cognitive communication deficit    Problem List Patient Active Problem List   Diagnosis Date Noted   CVA (cerebral vascular accident) (HCC) 11/29/2020   Ischemic stroke (HCC) 11/28/2020    Michelene Gardener [place], CCC-SLP 01/04/2021, 4:13 PM  [facility] [address] [place], [place], [address] Phone: [phone]   Fax:  [phone]   Name: Alexa Hatfield MRN: [id number] Date of Birth: 1970-10-02

## 2021-01-11 ENCOUNTER — Ambulatory Visit: Payer: 59 | Attending: Internal Medicine | Admitting: Speech Pathology

## 2021-01-11 ENCOUNTER — Encounter: Payer: Self-pay | Admitting: Speech Pathology

## 2021-01-11 ENCOUNTER — Other Ambulatory Visit: Payer: Self-pay

## 2021-01-11 DIAGNOSIS — R41841 Cognitive communication deficit: Secondary | ICD-10-CM | POA: Diagnosis present

## 2021-01-11 NOTE — Therapy (Signed)
[address] [place], [place], [address] Phone: [phone]   Fax:  [phone]  Speech Language Pathology Treatment & Discharge Summary  Patient Details  Name: Alexa Hatfield MRN: [id number] Date of Birth: 1970-02-14 Referring Provider (SLP): Kayleen Memos, [place]   Encounter Date: 01/11/2021   End of Session - 01/11/21 1613     Visit Number 4    Number of Visits 12    Date for SLP Re-Evaluation 03/15/21    SLP Start Time 1315    SLP Stop Time  1400    SLP Time Calculation (min) 45 min    Activity Tolerance Patient tolerated treatment well             Past Medical History:  Diagnosis Date   Complication of anesthesia    "difficulty breathing and required O2 during c-section"   Medical history non-contributory     Past Surgical History:  Procedure Laterality Date   AMPUTATION Left 02/25/2018   Procedure: AMPUTATION OF FINGER;  Surgeon: Dayna Barker, MD;  Location: [facility];  Service: Plastics;  Laterality: Left;   BUBBLE STUDY  11/30/2020   Procedure: BUBBLE STUDY;  Surgeon: Freada Bergeron, MD;  Location: [facility];  Service: Cardiovascular;;   CESAREAN SECTION     x2   TEE WITHOUT CARDIOVERSION N/A 11/30/2020   Procedure: TRANSESOPHAGEAL ECHOCARDIOGRAM (TEE);  Surgeon: Freada Bergeron, MD;  Location: [facility] ENDOSCOPY;  Service: Cardiovascular;  Laterality: N/A;    There were no vitals filed for this visit.   Subjective Assessment - 01/11/21 1320     Subjective "i feel like I am good."    Currently in Pain? No/denies                   ADULT SLP TREATMENT - 01/11/21 1609       General Information   Behavior/Cognition Alert;Cooperative      Treatment Provided   Treatment provided Cognitive-Linquistic      Cognitive-Linquistic Treatment   Treatment focused on Cognition    Skilled Treatment Completed memory strategies. Pt continues to have mild deficits with attention and  executive functioning skills, though pt feels "she is fine". Pt reportedly would like to d/c due to finanical concerns. Pt will return if she returns to work and experiences increased difficulty. SLP to d/c patient. SLUMS 25/30      Assessment / Recommendations / Plan   Plan Continue with current plan of care      Progression Toward Goals   Progression toward goals Progressing toward goals                SLP Short Term Goals - 01/11/21 1614       SLP SHORT TERM GOAL #1   Title Pt will comprehend functional memory or visual aids for recall of important information during structured conversations with minA.    Time 6    Period Weeks    Status Partially Met    Target Date 01/26/21      SLP SHORT TERM GOAL #2   Title Pt will increase auditory comprehension by recalling and verbalizing strategies to assist with active listening during conversation with minA.    Time 6    Period Weeks    Status Partially Met    Target Date 01/26/21              SLP Long Term Goals - 01/11/21 1614       SLP  LONG TERM GOAL #1   Title Pt will demonstrate understanding and verbalize use of functional memory/visual aids to recall important information in unstructured conversations independently.    Time 12    Period Weeks    Status Not Met      SLP LONG TERM GOAL #2   Title Pt will increase auditory comprehension by recalling and verbalizing strategies to assist with active listening during unstructured conversation independently.    Time 12    Period Weeks    Status Not Met      SLP LONG TERM GOAL #3   Status Not Met              Plan - 01/11/21 1614     Clinical Impression Statement See tx note. 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 Pt wishes to d/c at this time due to financial concerns. SLP to d/c this date.    Speech Therapy Frequency 1x /week    Duration 12  weeks    Treatment/Interventions Cueing hierarchy;Functional tasks;Patient/family education;Environmental controls;Cognitive reorganization;Multimodal communcation approach;Language facilitation;Compensatory techniques;SLP instruction and feedback;Internal/external aids    Potential to Achieve Goals Good    Consulted and Agree with Plan of Care Patient             Patient will benefit from skilled therapeutic intervention in order to improve the following deficits and impairments:   Cognitive communication deficit    Problem List Patient Active Problem List   Diagnosis Date Noted   CVA (cerebral vascular accident) ([place]) 11/29/2020   Ischemic stroke ([place]) 11/28/2020   SPEECH THERAPY DISCHARGE SUMMARY  Visits from Start of Care: 4  Current functional level related to goals / functional outcomes: Continues to have mild cognitive deficits.    Remaining deficits: Executive functioning, attention   Education / Equipment: Completed on memory and attention strategies.    Patient agrees to discharge. Patient goals were partially met. Patient is being discharged due to financial reasons.Verdene Lennert, [place] 01/11/2021, 4:15 PM  [address] [place], [place], [address] Phone: [phone]   Fax:  [phone]   Name: Alexa Hatfield MRN: [id number] Date of Birth: 08/08/1970

## 2021-01-16 ENCOUNTER — Other Ambulatory Visit: Payer: Self-pay | Admitting: Cardiology

## 2021-01-16 DIAGNOSIS — I639 Cerebral infarction, unspecified: Secondary | ICD-10-CM

## 2021-01-16 DIAGNOSIS — I4891 Unspecified atrial fibrillation: Secondary | ICD-10-CM

## 2021-01-17 ENCOUNTER — Telehealth: Payer: Self-pay

## 2021-01-17 NOTE — Telephone Encounter (Signed)
I called patient using [employer] to discuss. No answer, left a message asking her to call us back.

## 2021-01-17 NOTE — Progress Notes (Signed)
Kindly inform the patient that cardiac monitoring study did not shows any significant worrisome arrhythmias.

## 2021-01-17 NOTE — Telephone Encounter (Signed)
Please route to POD 2 calls if patient calls back.

## 2021-01-17 NOTE — Telephone Encounter (Deleted)
-----   Message from Pramod S Sethi, MD sent at 01/17/2021  4:33 PM EST ----- °Kindly inform the patient that cardiac monitoring study did not shows any significant worrisome arrhythmias. °

## 2021-01-17 NOTE — Progress Notes (Signed)
Let patient know monitor showed no serious rhythm issues.

## 2021-01-18 ENCOUNTER — Telehealth: Payer: Self-pay | Admitting: *Deleted

## 2021-01-18 NOTE — Telephone Encounter (Signed)
Message was left on 01/17/21 through [employer].

## 2021-01-18 NOTE — Telephone Encounter (Signed)
See note on 01/18/21 for more information.

## 2021-01-18 NOTE — Telephone Encounter (Signed)
-----   Message from Garvin Fila, MD sent at 01/17/2021  4:33 PM EST ----- Kindly inform the patient that cardiac monitoring study did not shows any significant worrisome arrhythmias.

## 2021-01-18 NOTE — Telephone Encounter (Signed)
The patient was called again through interpreter services. The results were left on her voicemail. Provided our call back number in case she has questions.

## 2021-02-10 ENCOUNTER — Ambulatory Visit: Payer: No Typology Code available for payment source | Admitting: Internal Medicine

## 2021-02-16 ENCOUNTER — Inpatient Hospital Stay: Payer: No Typology Code available for payment source | Admitting: Neurology

## 2023-01-22 IMAGING — MR MR MRA HEAD W/O CM
1 series · 19 of 48 positions shown · non-contrast
Comparison: MRI of the brain November 28, 2020.

CLINICAL DATA: Neuro deficit, acute, stroke suspected.

EXAM:
MRA HEAD WITHOUT CONTRAST
TECHNIQUE: Angiographic images of the Circle of Willis were acquired using MRA
technique without intravenous contrast.

[Series 5: 3d cow · axial · 0.5mm · 0.41mm/px · z∈[-171,-90]mm · 19 of 188 slices shown]
[im 1/188]
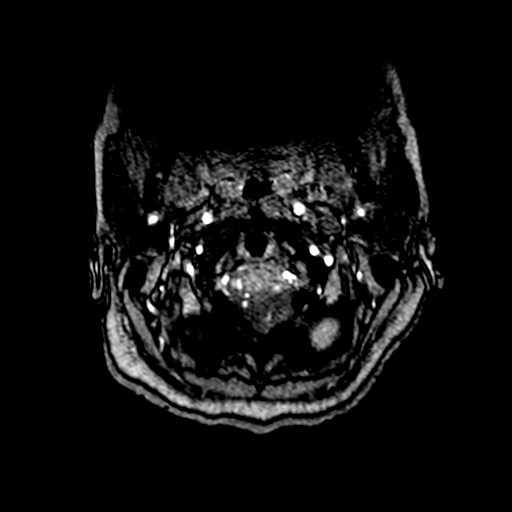
[im 4/188]
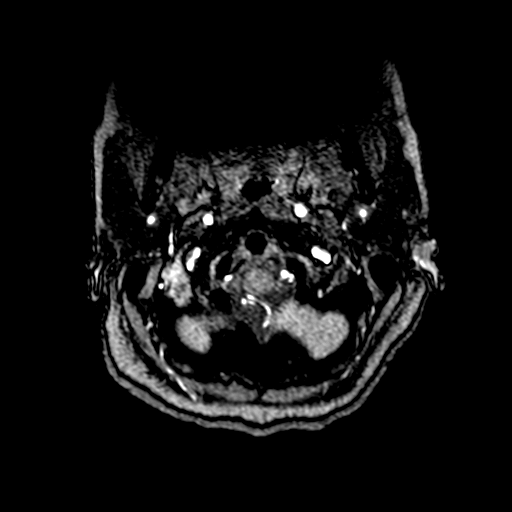
[im 8/188]
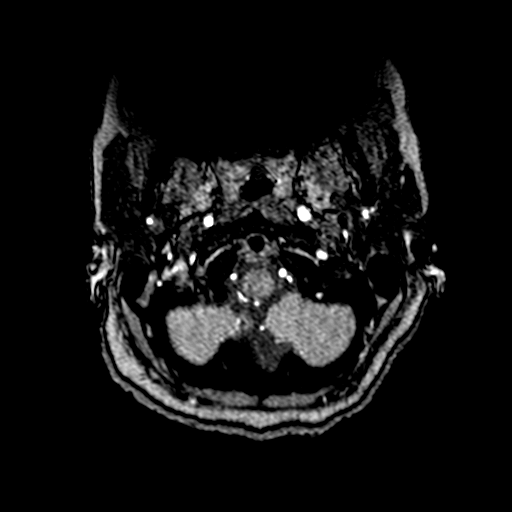
[im 12/188]
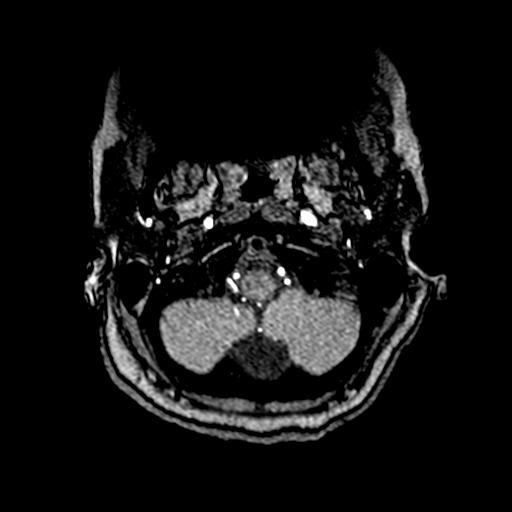
[im 16/188]
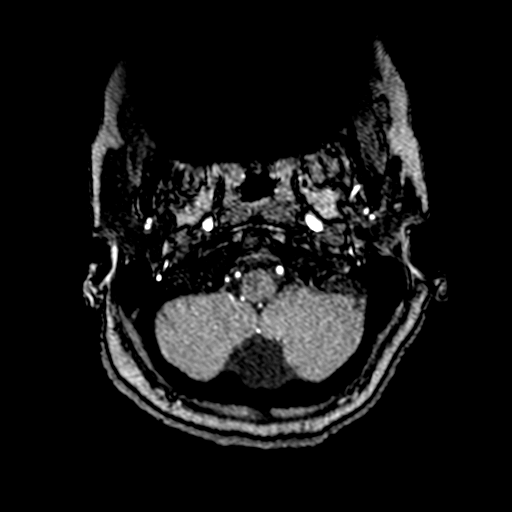
[im 20/188]
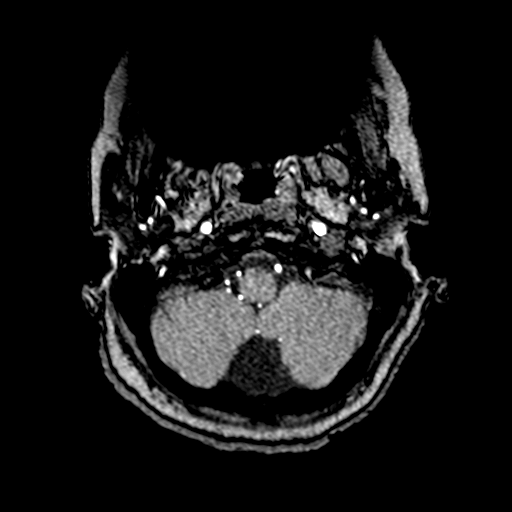
[im 24/188]
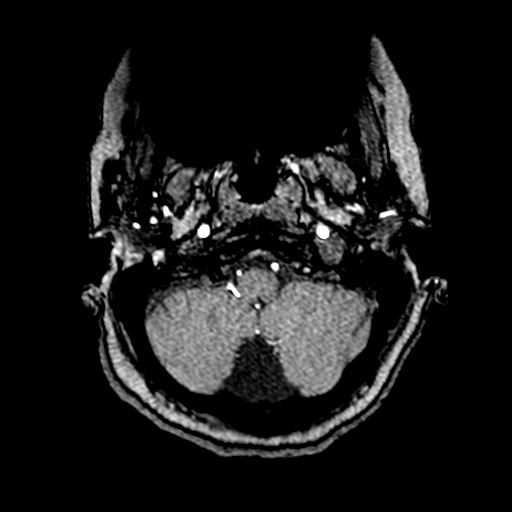
[im 28/188]
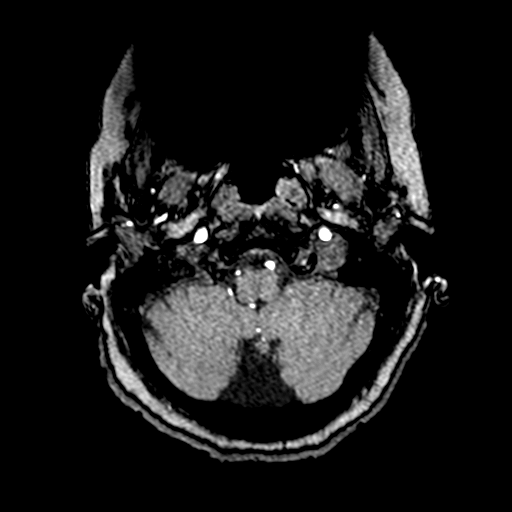
[im 32/188]
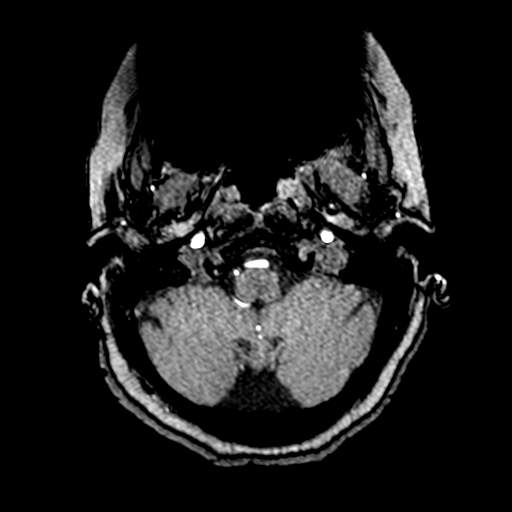
[im 36/188]
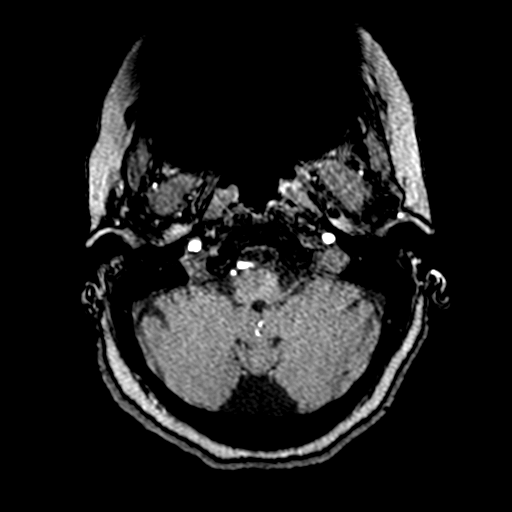
[im 40/188]
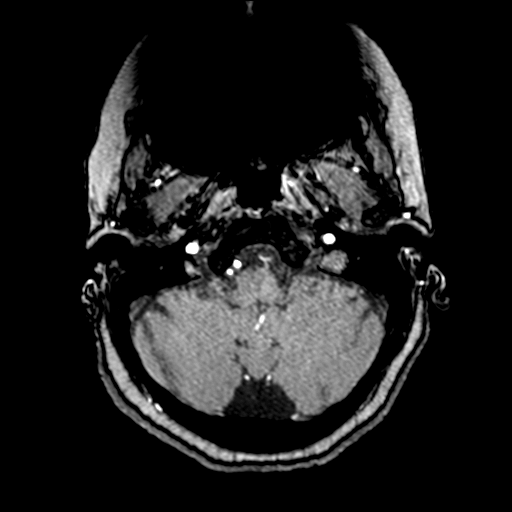
[im 60/188]
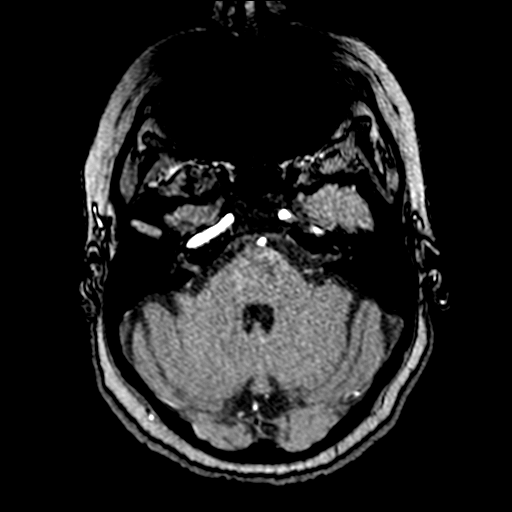
[im 84/188]
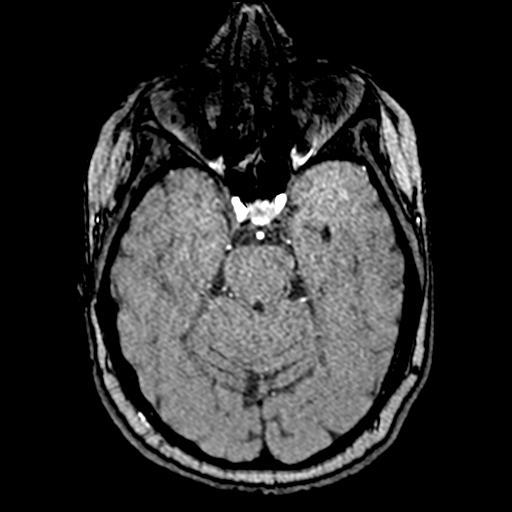
[im 96/188]
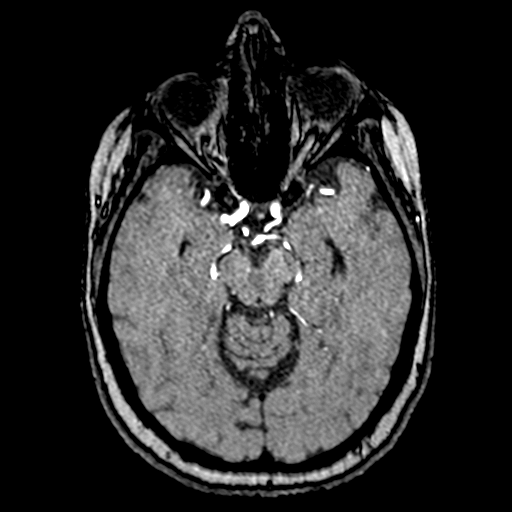
[im 108/188]
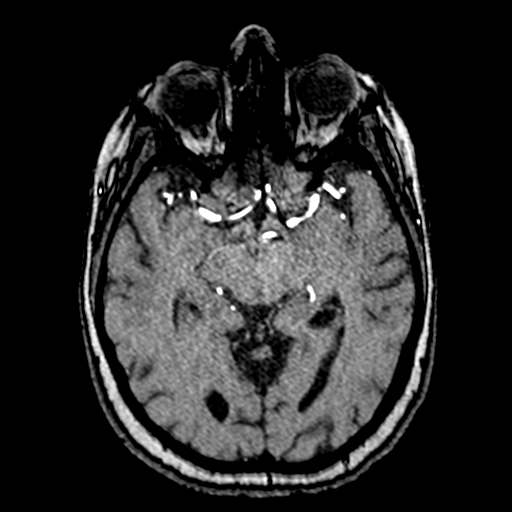
[im 132/188]
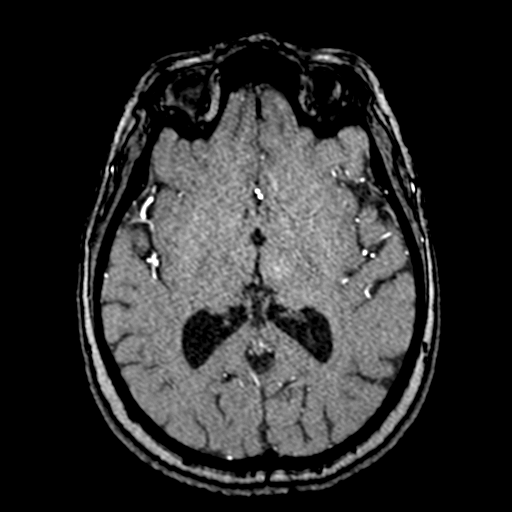
[im 156/188]
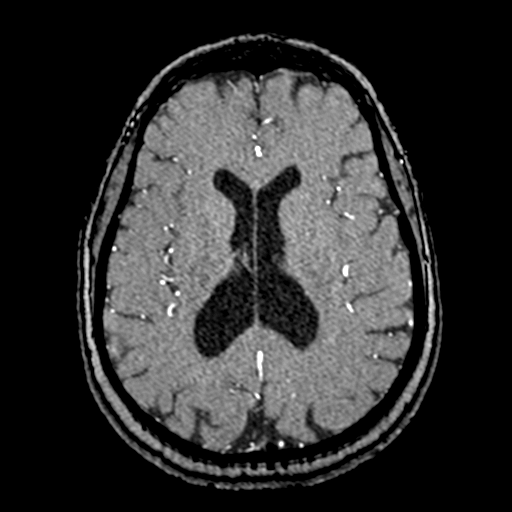
[im 160/188]
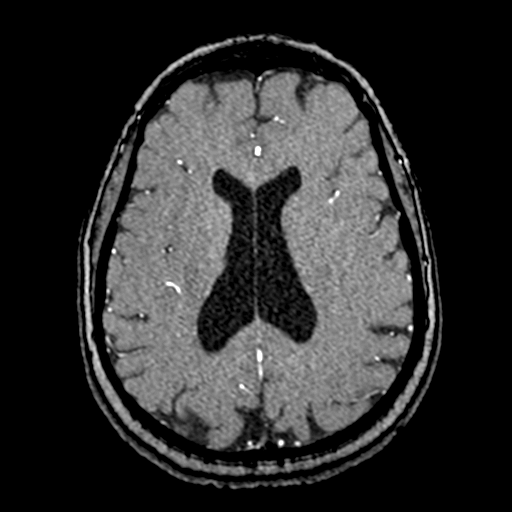
[im 180/188]
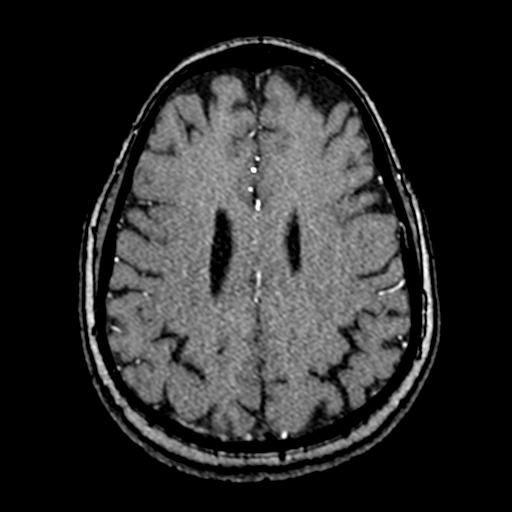

[19 of 48 positions shown; findings below may reference images not displayed]

FINDINGS: Anterior circulation: Mild luminal irregularity in the proximal
petrous and paraclinoid segment may be related to intracranial
atherosclerotic disease versus artifact without hemodynamically
significant stenosis. Bilateral ACA and MCA vascular trees are
preserved.

Posterior circulation: Signal loss of the proximal left PICA
suggesting occlusion with normal flow related enhancement in its
distal segments. The basilar artery has a relatively small caliber
noting presence of prominent bilateral posterior communicating
arteries. No hemodynamically significant stenosis of the basilar
artery or posterior cerebral arteries.

Anatomic variants: Prominent bilateral posterior communicating
arteries.

Other: None.
IMPRESSION: Suspected occlusion of the proximal left PICA with normal flow
related enhancement of its distal segment.
